# Patient Record
Sex: Male | Born: 1990 | Race: White | Hispanic: No | Marital: Married | Smoking: Never smoker
Health system: Southern US, Academic
[De-identification: ages and names within clinical notes are randomized; demographics above are authoritative.]

## PROBLEM LIST (undated history)

## (undated) DIAGNOSIS — J45909 Unspecified asthma, uncomplicated: Secondary | ICD-10-CM

## (undated) DIAGNOSIS — E559 Vitamin D deficiency, unspecified: Secondary | ICD-10-CM

## (undated) DIAGNOSIS — E538 Deficiency of other specified B group vitamins: Secondary | ICD-10-CM

## (undated) DIAGNOSIS — K219 Gastro-esophageal reflux disease without esophagitis: Secondary | ICD-10-CM

## (undated) HISTORY — PX: HX TONSILLECTOMY: SHX27

## (undated) HISTORY — DX: Vitamin D deficiency, unspecified: E55.9

## (undated) HISTORY — DX: Deficiency of other specified B group vitamins: E53.8

## (undated) HISTORY — PX: SHOULDER ARTHROSCOPY W/ LABRAL REPAIR: SHX2399

---
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
## 2019-06-07 LAB — ENTER/EDIT EXTERNAL COMMON LAB RESULTS
CHOLESTEROL: 210
HDL-CHOLESTEROL: 43
LDL (CALCULATED): 137
LDL CHOLESTEROL,DIRECT: 30
TRIGLYCERIDES: 149

## 2022-01-14 ENCOUNTER — Other Ambulatory Visit (HOSPITAL_COMMUNITY): Payer: Self-pay

## 2022-01-14 DIAGNOSIS — M25512 Pain in left shoulder: Secondary | ICD-10-CM

## 2022-01-14 DIAGNOSIS — S43401D Unspecified sprain of right shoulder joint, subsequent encounter: Secondary | ICD-10-CM

## 2022-01-25 ENCOUNTER — Inpatient Hospital Stay
Admission: RE | Admit: 2022-01-25 | Discharge: 2022-01-25 | Disposition: A | Discharge status: Home / Self Care | Payer: No Typology Code available for payment source | Source: Ambulatory Visit

## 2022-01-25 ENCOUNTER — Inpatient Hospital Stay (HOSPITAL_COMMUNITY)
Admission: RE | Admit: 2022-01-25 | Discharge: 2022-01-25 | Disposition: A | Discharge status: Home / Self Care | Payer: No Typology Code available for payment source | Source: Ambulatory Visit

## 2022-01-25 ENCOUNTER — Other Ambulatory Visit: Payer: Self-pay

## 2022-01-25 DIAGNOSIS — M25512 Pain in left shoulder: Secondary | ICD-10-CM | POA: Insufficient documentation

## 2022-01-25 DIAGNOSIS — S43401D Unspecified sprain of right shoulder joint, subsequent encounter: Secondary | ICD-10-CM | POA: Insufficient documentation

## 2022-02-02 ENCOUNTER — Telehealth (INDEPENDENT_AMBULATORY_CARE_PROVIDER_SITE_OTHER): Payer: Self-pay | Admitting: Internal Medicine

## 2022-02-02 NOTE — Telephone Encounter (Signed)
PATIENT IS REQUESTING MRI RESULTS   THANKS OLIVIA

## 2022-02-04 NOTE — Telephone Encounter (Signed)
Let patient know that the right shoulder MRI shows a labral tear and there is possibly one on the left I would recommend orthopedics for 2nd opinion

## 2022-02-11 NOTE — Nursing Note (Signed)
Patient went to [facility] and they gave referral to ortho.  Patient being seen by ortho for shoulder tear.

## 2022-08-03 ENCOUNTER — Other Ambulatory Visit: Payer: Self-pay

## 2022-08-03 ENCOUNTER — Inpatient Hospital Stay (HOSPITAL_BASED_OUTPATIENT_CLINIC_OR_DEPARTMENT_OTHER)
Admission: RE | Admit: 2022-08-03 | Discharge: 2022-08-03 | Disposition: A | Discharge status: Home / Self Care | Payer: No Typology Code available for payment source | Source: Ambulatory Visit | Attending: Orthopaedic Surgery | Admitting: Orthopaedic Surgery

## 2022-08-03 ENCOUNTER — Other Ambulatory Visit (HOSPITAL_COMMUNITY): Payer: Self-pay | Admitting: Orthopaedic Surgery

## 2022-08-03 ENCOUNTER — Other Ambulatory Visit: Payer: No Typology Code available for payment source | Attending: Orthopaedic Surgery

## 2022-08-03 DIAGNOSIS — Z01818 Encounter for other preprocedural examination: Secondary | ICD-10-CM | POA: Insufficient documentation

## 2022-08-03 LAB — CBC WITH DIFF
BASOPHIL #: 0.1 10*3/uL (ref 0.00–0.10)
BASOPHIL %: 1 % (ref 0–1)
EOSINOPHIL #: 0.2 10*3/uL (ref 0.00–0.50)
EOSINOPHIL %: 2 %
HCT: 44.4 % (ref 36.7–47.1)
HGB: 14.9 g/dL (ref 12.5–16.3)
LYMPHOCYTE #: 3.1 10*3/uL — ABNORMAL HIGH (ref 1.00–3.00)
LYMPHOCYTE %: 34 % (ref 16–44)
MCH: 30.8 pg (ref 23.8–33.4)
MCHC: 33.5 g/dL (ref 32.5–36.3)
MCV: 92 fL (ref 73.0–96.2)
MONOCYTE #: 0.9 10*3/uL (ref 0.30–1.00)
MONOCYTE %: 10 % (ref 5–13)
MPV: 8.5 fL (ref 7.4–11.4)
NEUTROPHIL #: 5 10*3/uL (ref 1.85–7.80)
NEUTROPHIL %: 54 % (ref 43–77)
PLATELETS: 431 10*3/uL (ref 140–440)
RBC: 4.83 10*6/uL (ref 4.06–5.63)
RDW: 13.1 % (ref 12.1–16.2)
WBC: 9.2 10*3/uL (ref 3.6–10.2)

## 2022-08-03 LAB — URINALYSIS, MICROSCOPIC
RBCS: 2 /hpf (ref <4)
SQUAMOUS EPITHELIAL: <1 /hpf (ref <28)

## 2022-08-03 LAB — URINALYSIS, MACROSCOPIC
BILIRUBIN: NEGATIVE mg/dL
BLOOD: 0.06 mg/dL — AB
GLUCOSE: NEGATIVE mg/dL
KETONES: NEGATIVE mg/dL
LEUKOCYTES: NEGATIVE WBCs/uL
NITRITE: NEGATIVE
PH: 5.5 (ref 5.0–9.0)
PROTEIN: NEGATIVE mg/dL
SPECIFIC GRAVITY: 1.031 — ABNORMAL HIGH (ref 1.002–1.030)
UROBILINOGEN: NORMAL mg/dL

## 2022-08-03 LAB — BASIC METABOLIC PANEL
ANION GAP: 9 mmol/L (ref 4–13)
BUN/CREA RATIO: 16 (ref 6–22)
BUN: 13 mg/dL (ref 7–25)
CALCIUM: 10 mg/dL (ref 8.6–10.3)
CHLORIDE: 108 mmol/L — ABNORMAL HIGH (ref 98–107)
CO2 TOTAL: 26 mmol/L (ref 21–31)
CREATININE: 0.82 mg/dL (ref 0.60–1.30)
ESTIMATED GFR: 122 mL/min/{1.73_m2} (ref >59)
GLUCOSE: 77 mg/dL (ref 74–109)
OSMOLALITY, CALCULATED: 284 mOsm/kg (ref 270–290)
POTASSIUM: 3.5 mmol/L (ref 3.5–5.1)
SODIUM: 143 mmol/L (ref 136–145)

## 2022-08-04 DIAGNOSIS — I498 Other specified cardiac arrhythmias: Secondary | ICD-10-CM

## 2022-08-04 DIAGNOSIS — R9431 Abnormal electrocardiogram [ECG] [EKG]: Secondary | ICD-10-CM

## 2022-08-04 LAB — ECG 12 LEAD
Atrial Rate: 79 {beats}/min
Calculated P Axis: 57 degrees
Calculated R Axis: 57 degrees
Calculated T Axis: 23 degrees
PR Interval: 186 ms
QRS Duration: 114 ms
QT Interval: 380 ms
QTC Calculation: 435 ms
Ventricular rate: 79 {beats}/min

## 2022-08-06 ENCOUNTER — Encounter (HOSPITAL_COMMUNITY): Payer: Self-pay | Admitting: Orthopaedic Surgery

## 2022-08-06 ENCOUNTER — Ambulatory Visit (HOSPITAL_COMMUNITY)
Payer: No Typology Code available for payment source | Admitting: Student in an Organized Health Care Education/Training Program

## 2022-08-06 ENCOUNTER — Inpatient Hospital Stay
Admission: RE | Admit: 2022-08-06 | Discharge: 2022-08-06 | Disposition: A | Discharge status: Home / Self Care | Payer: No Typology Code available for payment source | Source: Ambulatory Visit | Attending: Orthopaedic Surgery | Admitting: Orthopaedic Surgery

## 2022-08-06 ENCOUNTER — Encounter (HOSPITAL_COMMUNITY)
Admission: RE | Disposition: A | Discharge status: Home / Self Care | Payer: Self-pay | Source: Ambulatory Visit | Attending: Orthopaedic Surgery

## 2022-08-06 ENCOUNTER — Other Ambulatory Visit: Payer: Self-pay

## 2022-08-06 DIAGNOSIS — S43491A Other sprain of right shoulder joint, initial encounter: Secondary | ICD-10-CM | POA: Insufficient documentation

## 2022-08-06 DIAGNOSIS — Z6841 Body Mass Index (BMI) 40.0 and over, adult: Secondary | ICD-10-CM | POA: Insufficient documentation

## 2022-08-06 HISTORY — DX: Unspecified asthma, uncomplicated: J45.909

## 2022-08-06 HISTORY — DX: Gastro-esophageal reflux disease without esophagitis: K21.9

## 2022-08-06 SURGERY — ARTHROSCOPY SHOULDER
Anesthesia: General | Site: Shoulder | Laterality: Right | Wound class: Clean Wound: Uninfected operative wounds in which no inflammation occurred

## 2022-08-06 MED ORDER — DEXAMETHASONE SODIUM PHOSPHATE 4 MG/ML INJECTION SOLUTION
INTRAMUSCULAR | Status: AC
Start: 2022-08-06 — End: 2022-08-06
  Filled 2022-08-06: qty 1

## 2022-08-06 MED ORDER — SODIUM CHLORIDE 0.9 % (FLUSH) INJECTION SYRINGE
3.0000 mL | INJECTION | Freq: Three times a day (TID) | INTRAMUSCULAR | Status: DC
Start: 2022-08-06 — End: 2022-08-06

## 2022-08-06 MED ORDER — HYDROMORPHONE 2 MG/ML INJECTION WRAPPER
INJECTION | Freq: Once | INTRAMUSCULAR | Status: DC | PRN
Start: 2022-08-06 — End: 2022-08-06
  Administered 2022-08-06 (×2): 1 mg via INTRAVENOUS

## 2022-08-06 MED ORDER — SODIUM CHLORIDE 0.9 % INTRAVENOUS PIGGYBACK
INJECTION | INTRAVENOUS | Status: AC
Start: 2022-08-06 — End: 2022-08-06
  Filled 2022-08-06: qty 150

## 2022-08-06 MED ORDER — ONDANSETRON HCL (PF) 4 MG/2 ML INJECTION SOLUTION
4.0000 mg | Freq: Four times a day (QID) | INTRAMUSCULAR | Status: DC | PRN
Start: 2022-08-06 — End: 2022-08-06
  Administered 2022-08-06: 4 mg via INTRAVENOUS
  Filled 2022-08-06: qty 2

## 2022-08-06 MED ORDER — LIDOCAINE (PF) 100 MG/5 ML (2 %) INTRAVENOUS SYRINGE
INJECTION | Freq: Once | INTRAVENOUS | Status: DC | PRN
Start: 2022-08-06 — End: 2022-08-06
  Administered 2022-08-06: 100 mg via INTRAVENOUS

## 2022-08-06 MED ORDER — DEXMEDETOMIDINE 100 MCG/ML INTRAVENOUS SOLUTION
Freq: Once | INTRAVENOUS | Status: DC | PRN
Start: 2022-08-06 — End: 2022-08-06
  Administered 2022-08-06: 20 ug via INTRAVENOUS

## 2022-08-06 MED ORDER — FENTANYL (PF) 50 MCG/ML INJECTION SOLUTION
INTRAMUSCULAR | Status: AC
Start: 2022-08-06 — End: 2022-08-06
  Filled 2022-08-06: qty 2

## 2022-08-06 MED ORDER — ONDANSETRON HCL (PF) 4 MG/2 ML INJECTION SOLUTION
INTRAMUSCULAR | Status: AC
Start: 2022-08-06 — End: 2022-08-06
  Filled 2022-08-06: qty 2

## 2022-08-06 MED ORDER — ALBUTEROL SULFATE 2.5 MG/3 ML (0.083 %) SOLUTION FOR NEBULIZATION
2.5000 mg | INHALATION_SOLUTION | Freq: Once | RESPIRATORY_TRACT | Status: DC | PRN
Start: 2022-08-06 — End: 2022-08-06

## 2022-08-06 MED ORDER — MIDAZOLAM 5 MG/ML INJECTION WRAPPER
INTRAMUSCULAR | Status: AC
Start: 2022-08-06 — End: 2022-08-06
  Filled 2022-08-06: qty 1

## 2022-08-06 MED ORDER — FAMOTIDINE (PF) 20 MG/2 ML INTRAVENOUS SOLUTION
INTRAVENOUS | Status: AC
Start: 2022-08-06 — End: 2022-08-06
  Filled 2022-08-06: qty 2

## 2022-08-06 MED ORDER — ROPIVACAINE (PF) 2 MG/ML (0.2 %) INJECTION SOLUTION
INTRAMUSCULAR | Status: AC
Start: 2022-08-06 — End: 2022-08-06
  Filled 2022-08-06: qty 10

## 2022-08-06 MED ORDER — IPRATROPIUM 0.5 MG-ALBUTEROL 3 MG (2.5 MG BASE)/3 ML NEBULIZATION SOLN
3.0000 mL | INHALATION_SOLUTION | Freq: Once | RESPIRATORY_TRACT | Status: DC | PRN
Start: 2022-08-06 — End: 2022-08-06

## 2022-08-06 MED ORDER — LACTATED RINGERS INTRAVENOUS SOLUTION
INTRAVENOUS | Status: DC
Start: 2022-08-06 — End: 2022-08-06

## 2022-08-06 MED ORDER — MIDAZOLAM 5 MG/ML INJECTION WRAPPER
2.0000 mg | Freq: Once | INTRAMUSCULAR | Status: DC | PRN
Start: 2022-08-06 — End: 2022-08-06
  Administered 2022-08-06: 2 mg via INTRAVENOUS

## 2022-08-06 MED ORDER — OXYCODONE-ACETAMINOPHEN 5 MG-325 MG TABLET
1.0000 | ORAL_TABLET | ORAL | Status: DC | PRN
Start: 2022-08-06 — End: 2022-08-06
  Administered 2022-08-06: 1 via ORAL
  Filled 2022-08-06: qty 1

## 2022-08-06 MED ORDER — ONDANSETRON HCL (PF) 4 MG/2 ML INJECTION SOLUTION
4.0000 mg | Freq: Once | INTRAMUSCULAR | Status: AC
Start: 2022-08-06 — End: 2022-08-06
  Administered 2022-08-06: 4 mg via INTRAVENOUS

## 2022-08-06 MED ORDER — MIDAZOLAM 5 MG/ML INJECTION WRAPPER
Freq: Once | INTRAMUSCULAR | Status: DC | PRN
Start: 2022-08-06 — End: 2022-08-06
  Administered 2022-08-06: 5 mg via INTRAVENOUS

## 2022-08-06 MED ORDER — CEFAZOLIN 1 GRAM SOLUTION FOR INJECTION
INTRAMUSCULAR | Status: AC
Start: 2022-08-06 — End: 2022-08-06
  Filled 2022-08-06: qty 30

## 2022-08-06 MED ORDER — OXYCODONE-ACETAMINOPHEN 5 MG-325 MG TABLET
1.0000 | ORAL_TABLET | Freq: Four times a day (QID) | ORAL | 0 refills | Status: DC | PRN
Start: 2022-08-06 — End: 2024-02-28

## 2022-08-06 MED ORDER — SUCCINYLCHOLINE 20 MG/ML INTRAVENOUS WRAPPER
INJECTION | Freq: Once | INTRAVENOUS | Status: DC | PRN
Start: 2022-08-06 — End: 2022-08-06
  Administered 2022-08-06: 160 mg via INTRAVENOUS

## 2022-08-06 MED ORDER — MORPHINE 10 MG/ML INJECTION WRAPPER
INTRAVENOUS | Status: AC
Start: 2022-08-06 — End: 2022-08-06
  Filled 2022-08-06: qty 1

## 2022-08-06 MED ORDER — DEXAMETHASONE SODIUM PHOSPHATE 4 MG/ML INJECTION SOLUTION
4.0000 mg | Freq: Once | INTRAMUSCULAR | Status: AC
Start: 2022-08-06 — End: 2022-08-06
  Administered 2022-08-06: 4 mg via INTRAVENOUS

## 2022-08-06 MED ORDER — ONDANSETRON HCL (PF) 4 MG/2 ML INJECTION SOLUTION
4.0000 mg | Freq: Once | INTRAMUSCULAR | Status: DC | PRN
Start: 2022-08-06 — End: 2022-08-06

## 2022-08-06 MED ORDER — HYDROMORPHONE 2 MG/ML INJECTION WRAPPER
0.5000 mg | INJECTION | INTRAMUSCULAR | Status: DC | PRN
Start: 2022-08-06 — End: 2022-08-06

## 2022-08-06 MED ORDER — FENTANYL (PF) 50 MCG/ML INJECTION WRAPPER
INJECTION | Freq: Once | INTRAMUSCULAR | Status: DC | PRN
Start: 2022-08-06 — End: 2022-08-06
  Administered 2022-08-06: 50 ug via INTRAVENOUS
  Administered 2022-08-06: 100 ug via INTRAVENOUS
  Administered 2022-08-06: 50 ug via INTRAVENOUS

## 2022-08-06 MED ORDER — SODIUM CHLORIDE 0.9 % (FLUSH) INJECTION SYRINGE
3.0000 mL | INJECTION | INTRAMUSCULAR | Status: DC | PRN
Start: 2022-08-06 — End: 2022-08-06

## 2022-08-06 MED ORDER — SODIUM CHLORIDE 0.9 % INTRAVENOUS PIGGYBACK
2.0000 g | Freq: Once | INTRAVENOUS | Status: AC
Start: 2022-08-06 — End: 2022-08-06
  Administered 2022-08-06: 3 g via INTRAVENOUS

## 2022-08-06 MED ORDER — SUGAMMADEX 100 MG/ML INTRAVENOUS SOLUTION
Freq: Once | INTRAVENOUS | Status: DC | PRN
Start: 2022-08-06 — End: 2022-08-06
  Administered 2022-08-06: 500 mg via INTRAVENOUS

## 2022-08-06 MED ORDER — ROCURONIUM 10 MG/ML INTRAVENOUS SOLUTION
Freq: Once | INTRAVENOUS | Status: DC | PRN
Start: 2022-08-06 — End: 2022-08-06
  Administered 2022-08-06: 30 mg via INTRAVENOUS

## 2022-08-06 MED ORDER — PROCHLORPERAZINE EDISYLATE 10 MG/2 ML (5 MG/ML) INJECTION SOLUTION
5.0000 mg | Freq: Once | INTRAMUSCULAR | Status: DC | PRN
Start: 2022-08-06 — End: 2022-08-06

## 2022-08-06 MED ORDER — FENTANYL (PF) 50 MCG/ML INJECTION WRAPPER
50.0000 ug | INJECTION | INTRAMUSCULAR | Status: DC | PRN
Start: 2022-08-06 — End: 2022-08-06
  Administered 2022-08-06: 50 ug via INTRAVENOUS

## 2022-08-06 MED ORDER — FAMOTIDINE (PF) 20 MG/2 ML INTRAVENOUS SOLUTION
20.0000 mg | Freq: Once | INTRAVENOUS | Status: AC
Start: 2022-08-06 — End: 2022-08-06
  Administered 2022-08-06: 20 mg via INTRAVENOUS

## 2022-08-06 MED ORDER — FENTANYL (PF) 50 MCG/ML INJECTION WRAPPER
25.0000 ug | INJECTION | INTRAMUSCULAR | Status: DC | PRN
Start: 2022-08-06 — End: 2022-08-06
  Administered 2022-08-06 (×2): 25 ug via INTRAVENOUS

## 2022-08-06 MED ORDER — PROPOFOL 10 MG/ML IV BOLUS
INJECTION | Freq: Once | INTRAVENOUS | Status: DC | PRN
Start: 2022-08-06 — End: 2022-08-06
  Administered 2022-08-06: 250 mg via INTRAVENOUS

## 2022-08-06 MED ORDER — LACTATED RINGERS INTRAVENOUS SOLUTION
INTRAVENOUS | Status: DC
Start: 2022-08-06 — End: 2022-08-06
  Administered 2022-08-06: 0 via INTRAVENOUS

## 2022-08-06 SURGICAL SUPPLY — 46 items
ANCHOR SUT 1.8MM 2 KNOTLESS FIBERTAK GEN2 ×3 IMPLANT
BANDAGE COFLX 5YDX4IN NONST CHSV SLF ADH FOAM COMPRESS TAN LF (WOUND CARE SUPPLY) ×1 IMPLANT
BLADE 11 2 END CBNSTL SURG STRL DISP (SURGICAL CUTTING SUPPLIES) ×1 IMPLANT
BLADE SHAVER 13CM 4MM EXCLBR C_OOLCUT STRL DISP (ENDOSCOPIC SUPPLIES) ×1 IMPLANT
BURR SHAVER 13CM 4MM COOLCUT 8 FLUTE OVAL STRL DISP (ENDOSCOPIC SUPPLIES) ×1 IMPLANT
CANNULA ARTHRO 8.25MM 7CM TWIST-IN SHLDR OBTURATOR THREAD TRANSLUC STRL DISP (ENDOSCOPIC SUPPLIES) ×2 IMPLANT
CLEANER INSTR PREPZYME MUL-TRD CONTAINR NARSL NEUT PH BDGR 22OZ (MISCELLANEOUS PT CARE ITEMS) ×1
CONV USE 31829 - NEEDLE HYPO  18GA 1.5IN STD REG BVL LF (MED SURG SUPPLIES) ×1 IMPLANT
CONV USE ITEM 329146 - CLEANER INSTR PREPZYME MUL-TRD CONTAINR NARSL NEUT PH BDGR 22OZ (MISCELLANEOUS PT CARE ITEMS) ×1 IMPLANT
COUNTER 20 CNT BLOCK ADH NEEDLE STRL LF  RD SHARP FOAM 15.75X11.5X14IN DISP (MED SURG SUPPLIES) ×1 IMPLANT
COVER 53X24IN MAYOSTAND PRXM STRL DISP EQP SMS LF (DRAPE/PACKS/SHEETS/OR TOWEL) ×2 IMPLANT
COVER TBL 90X50IN STD SMS REINF FNFLD STRL LF  DISP (DRAPE/PACKS/SHEETS/OR TOWEL) ×3 IMPLANT
DISCONTINUED NO SUB - DRESS TRNSPR 2.75X2.375IN FILM IV STRL LF (WOUND CARE SUPPLY) ×5 IMPLANT
DISCONTINUED NO SUB - DRESS TRNSPR 4.5X4IN FILM IV STRL LF (WOUND CARE SUPPLY) ×1 IMPLANT
DRAPE INCS ANTIMIC 23X23IN IOBN2 TRNSPR (DRAPE/PACKS/SHEETS/OR TOWEL) ×1 IMPLANT
DRESS TRNSPR 2.75X2.375IN FILM IV STRL LF (WOUND CARE/ENTEROSTOMAL SUPPLY) ×5
DRESS TRNSPR 4.5X4IN FILM IV STRL LF (WOUND CARE/ENTEROSTOMAL SUPPLY) ×1
GLOVE SURG 7.5 LF  PF SMOOTH BEAD CUF INTLK STRL BLU 11.8IN PROTEXIS NEU-THERA PLISPRN THK7.9 MIL (GLOVES AND ACCESSORIES) ×4 IMPLANT
GLOVE SURG 7.5 LTX PF SMOOTH BEAD CUF STRL YW 12IN PROTEXIS (GLOVES AND ACCESSORIES) ×2 IMPLANT
GOWN SURG XL STD LGTH L3 HKLP CLSR RGLN SLEEVE TWL STRL LF  DISP GRN AERO BLU PRFRM FBRC (DRAPE/PACKS/SHEETS/OR TOWEL) ×1 IMPLANT
GOWN SURG XL STD LGTH L3 NONREINFORCE HKLP CLSR TWL STRL LF  DISP BLU SPECTRUM SMS (DRAPE/PACKS/SHEETS/OR TOWEL) ×1
GOWN SURG XL STD LGTH L3 NONREINFORCE HKLP CLSR TWL STRL LF DISP BLU SPECTRUM SMS (DRAPE/PACKS/SHEETS/OR TOWEL) ×1 IMPLANT
GOWN SURG XL XLNG L4 REINF HKLP CLSR SET IN SLEEVE STRL LF  DISP BLU SIRUS SMS PE 56IN (DRAPE/PACKS/SHEETS/OR TOWEL) ×1 IMPLANT
INSTR ORTHO 1.8MM SHAVER DRILL FLXB OBTURATOR (SURGICAL INSTRUMENTS) ×1 IMPLANT
LABEL MED CORRECT MED LABELING SYS 4 FLG 2 SHEET 24 PRPRNT STRL (MED SURG SUPPLIES) ×1 IMPLANT
MAT FLR 110X29IN STD ABSRBENT WATERPROF BACKSHEET RL (MED SURG SUPPLIES) ×3 IMPLANT
NEEDLE HYPO  18GA 1.5IN STD REG BVL LF (MED SURG SUPPLIES) ×1
NEEDLE SPINAL PNK 3.5IN 18GA QUINCKE REG WL POLYPROP QUINCKE TIP STRL LF  DISP (MED SURG SUPPLIES) ×1 IMPLANT
NEEDLE SUT MEGALOADER HD SCORPION (SUTURE/WOUND CLOSURE) ×1 IMPLANT
PACK SURG ECLIPSE SHLDR PCH STRL DISP 114X77IN 100X66IN LF (DRAPE/PACKS/SHEETS/OR TOWEL) ×1 IMPLANT
PAD ABDOMINAL 8X7.5IN LF  STRL (WOUND CARE SUPPLY) ×1 IMPLANT
PASSER SUTLASSO SD 90D UP STR 1.8MM 3.8MM SHLDR THB PAD SUT NITINOL LABRAL ASCP ROTR CUF REPR STRL (SUTURE/WOUND CLOSURE) ×1 IMPLANT
PROBE ESURG APOLLORF 90D ML PRT (SURGICAL CUTTING SUPPLIES) ×1 IMPLANT
PUMP TUBING 13FT CONT WV III DUALWAVE ARTHRO STRL DISP (MED SURG SUPPLIES) ×1 IMPLANT
SET TUBING DUALWAVE CASSETTE OFLW ASCP PUMP STRL DISP (ENDOSCOPIC SUPPLIES) ×1 IMPLANT
SLING ORTHO LRG 17.5X8.5IN ARM SHLDR PAD ENV BRTHBL HKLP (ORTHOPEDICS (NOT IMPLANTS)) ×1 IMPLANT
SOL IRRG LR 3L PRSV N-PYRG FLXB CONTAINR STRL LF (MEDICATIONS/SOLUTIONS) ×2 IMPLANT
SPONGE GAUZE 2X2IN COTTON 8P WOVEN FOLD EDGE XRY DETECT LF  STRL (WOUND CARE SUPPLY) ×4 IMPLANT
SPONGE GAUZE 4X4IN MDCHC COTTON 12 PLY TY 7 LF  STRL DISP (WOUND CARE SUPPLY) ×1 IMPLANT
SPONGE LAP 18X18IN PREWASH RIGID TRY STRL LF  WHT (MED SURG SUPPLIES) ×1 IMPLANT
SUTURE 4-0 C-14 SURGIPRO2 18IN BLU MONOF NONAB (SUTURE/WOUND CLOSURE) ×2 IMPLANT
SYRINGE LL 10ML LF  STRL GRAD N-PYRG DEHP-FR PVC FREE MED DISP (MED SURG SUPPLIES) ×1 IMPLANT
TOWEL 24X16IN COTTON BLU DISP SURG STRL LF (DRAPE/PACKS/SHEETS/OR TOWEL) ×4 IMPLANT
TUBING IRRG 6FT DUALWAVE BKFL CK VALVE EXT STRL DISP (ENDOSCOPIC SUPPLIES) ×1 IMPLANT
TUBING SUCT CLR 12FT .25IN ARGYLE PVC NCDTV STR MALE FEMALE MLD CONN STRL LF (MED SURG SUPPLIES) ×3 IMPLANT
TUBING SUCT CLR 6FT .25IN ARGYLE PVC NCDTV STR MALE FEMALE MLD CONN STRL LF (MED SURG SUPPLIES) ×1 IMPLANT

## 2022-08-06 NOTE — H&P (Signed)
Paper H&P attached to chart. See scanned document.

## 2022-08-06 NOTE — Anesthesia Postprocedure Evaluation (Signed)
Anesthesia Post Op Evaluation    Patient: Bradley Mendoza  Procedure(s):  ARTHROSCOPIC LABRAL REPAIR OF RIGHT SHOULDER    Last Vitals:Temperature: 36.2 C (97.1 F) (08/06/22 1711)  Heart Rate: 64 (08/06/22 1711)  BP (Non-Invasive): 129/78 (08/06/22 1711)  Respiratory Rate: 12 (08/06/22 1711)  SpO2: 99 % (08/06/22 1711)    No notable events documented.    Patient is sufficiently recovered from the effects of anesthesia to participate in the evaluation and has returned to their pre-procedure level.  Patient location during evaluation: PACU       Patient participation: complete - patient participated  Level of consciousness: awake and alert and responsive to verbal stimuli    Pain management: adequate  Airway patency: patent    Anesthetic complications: no  Cardiovascular status: acceptable  Respiratory status: acceptable  Hydration status: acceptable  Patient post-procedure temperature: Pt Normothermic   PONV Status: Absent

## 2022-08-06 NOTE — Discharge Instructions (Addendum)
Follow up in office on January 26th, at 11:10 with Dr. Kathlen Brunswick.     Orthopaedic Discharge Instructions:    Non weightbearing to the operative extremity.  Maintain sling.  No active range of motion of the shoulder.    May remove dressing on postoperative day 3.  Leave incisions open to air if dry.  Cover with gauze dressing if there is persistent drainage. Call the office with any questions or problems.    Ice to alleviate pain and swelling    Take pain medication as prescribed    Follow-up in the orthopedics clinic at your scheduled appointment.  Please call the office to confirm your appointment.  Please also call with any questions or concerns.      [place] [facility], [place], [place] [address]  [phone]

## 2022-08-06 NOTE — Interval H&P Note (Signed)
[facility]      H&P UPDATE FORM                                                                                  Valeria, Krisko, 32 y.o. male  Date of Admission:  08/06/2022  Date of Birth:  01/31/1991    08/06/2022    STOP: IF H&P IS GREATER THAN 30 DAYS FROM SURGICAL DAY COMPLETE NEW H&P IS REQUIRED.     H & P updated the day of the procedure.  1.  H&P completed within 30 days of surgical procedure and has been reviewed within 24 hours of admission but prior to surgery or a procedure requiring anesthesia services, the patient has been examined, and no change has occured in the patients condition since the H&P was completed.       Change in medications: No              Comments:     2.  Patient continues to be appropriate candidate for planned surgical procedure. YES    [employer], DO

## 2022-08-06 NOTE — Anesthesia Procedure Notes (Signed)
Bradley Mendoza    Airway Note  General Information and Staff   Authorizing provider: Lavena Stanford, DO  Performing provider: Jasmine December, CRNA        Urgency: elective    Airway not difficult    Indications and Patient Condition  Pt location: In Or  Indications for airway management: anesthesia  Spontaneous ventilation: present  Sedation level: deep  Preoxygenated: yes  Patient position: sniffing  Mask difficulty assessment: 1 - vent by mask        Final Airway Details  Final airway type: endotracheal airway        Successful airway: ETT     Successful intubation technique: direct laryngoscopy  Facilitating devices/methods: cricoid pressure              Blade: Macintosh  Blade size: #3  Airway size (mm): 8.0  Cormack-Lehane Classification: grade I - full view of glottis  Placement verified by: chest auscultation and capnometry   Marked at 24  Measured from: lips  Secured with: Tape  Number of attempts at approach: 1Airway complications: Atraumatic

## 2022-08-06 NOTE — OR Surgeon (Signed)
ORTHOPAEDIC SURGERY OPERATIVE REPORT:    Patient Name: Bradley Mendoza  Date of Birth: Mar 05, 1991  Age: 32 y.o.  Gender: male  MRN: [id number]    Date of Surgery: 08/06/2022     Surgeon: Murvin Donning, DO    Preoperative Diagnosis:  Right shoulder labral tear    Postoperative Diagnosis:  Right shoulder labral tear    Procedure(s):  Diagnostic and operative arthroscopy right shoulder with labral repair    Type of Anesthesia: General    Anesthesia Staff: Anesthesiologist: Lavena Stanford, DO  CRNA: Donita Brooks, CRNA; Jasmine December, CRNA   OR Staff: Circulator: Leonia Reeves, RN  PERIOPERATIVE CARE ASSISTANT: Ellwood Dense, [place]; Avel Peace, PCA  Relief Circulator: Lollie Marrow, RN  Scrub Person: Cristine Polio, ST  Scrub First Assist: Kathlen Brunswick, ST   Implants: 3 [employer] knotless FiberTak anchors     Implant Name Type Inv. Item Serial No. Manufacturer Lot No. LRB No. Used Action   ANCHOR SUT 1.8MM 2 KNOTLESS FIBERTAK GEN2 - [id number]  ANCHOR SUT 1.8MM 2 KNOTLESS [place] [id number] AR[employer]X [id number] Right 1 Implanted   ANCHOR SUT 1.8MM 2 KNOTLESS FIBERTAK GEN2 - [id number]  ANCHOR SUT 1.8MM 2 KNOTLESS [facility] GEN2 [id number] AR[employer]X [id number] Right 1 Implanted   ANCHOR SUT 1.8MM 2 KNOTLESS FIBERTAK GEN2 - [id number]  ANCHOR SUT 1.8MM 2 KNOTLESS [place] [id number] AR[employer]X [id number] Right 1 Implanted        Estimated Blood Loss:  Less than 5 mL    Counts:     Sponge: Correct    Needle: Correct    Sharp: Correct    Instrument: Correct    Drains and/or Packs: None    Significant Events: None    Specimen(s) Removed: * No specimens in log *     Description of Findings: Routine findings consistent with pre-operative diagnosis.    Post-Op Condition of Patient: Stable to PACU    Indications:     Bradley Mendoza  is a very pleasant  32 y.o. male  presenting to [facility] for right shoulder arthroscopy with labral repair.  Patient continues to report right shoulder pain despite adequate conservative management  consisting of injections and physical therapy.  MRI demonstrated anterior labral tear.      Procedure discussed in great detail.  Risks benefits potential complications and outcomes were discussed at length and patient verbalized understanding and did provide written informed consent to proceed.    Procedure:     Patient seen and evaluated in the preoperative area with parents at bedside.  Right shoulder marked as correct operative site.  Appropriate preoperative antibiotics were administered at this time.  H&P reviewed written consent confirmed and posted on the chart.  Patient then taken back to the operative suite transferred to the OR table in the supine position.  All bony prominences covered and padded appropriately.  General anesthesia administered.  Once adequate anesthesia was achieved patient was placed in the lateral decubitus position once again ensuring to pad all bony prominences appropriately.  The shoulder then placed in traction and prepped and draped in normal sterile fashion.  Formal timeout was performed.     Upon completion of timeout posterior viewing portal was established and arthroscope inserted into the glenohumeral joint.  Both the glenoid and humeral head were visualized.  We proceeded to visualize the long head of the biceps and biceps anchor which was intact.  Articular surface of the rotator cuff was visualized and deemed to be intact.  Proceeded the inferior axillary  recess where there was no evidence of loose body.  We then visualized the anterior labrum there was evidence of an extensive tear.  Visualized the subscap tendon which was deemed intact.  At this time anterior portal was established under direct visualization using 18 gauge spinal needle to localize placement.  We then established our superior portal to shuttle sutures during the repair.  18 gauge spinal needle was also used to localize placement.  At this time we performed our labral repair using 3 knotless FiberTak  anchors placed on the anterior aspect of the glenoid.  We did ensure to work from inferior to superior to allow for appropriate visualization and repair.  At this time shoulder was lavaged and then all fluid suctioned from the shoulder.  All instrumentation removed.  Portals closed with nylon suture in simple interrupted fashion.  Patient was placed into an arm sling.  Incisions covered with 4x4s and tape.  Patient was awoke from anesthesia transferred back to the hospital gurney.  Was taken to PACU in stable and satisfactory condition.  Tolerated procedure without complication.     Disposition:      Patient taken to PACU in stable and satisfactory condition.  Stable for discharge home from an orthopedic perspective once cleared per PACU protocol.  He is remained nonweightbearing to the operative extremity.  Maintain sling.  Take pain medication as prescribed.  Follow up in the orthopedics clinic in approximately 2 weeks, sooner if necessary.  Family was in agreement with treatment plan all questions answered to their apparent satisfaction prior to discharge.    Rebeca Alert, D.O.  08/06/22 15:24  [place]  Please call with any questions/concerns     This note has been created with voice recognition software.  Please excuse any errors in transcription.  Occasional wrong word or sound alike substitutions may have occurred due to the inherent limitations of voice recognition software.  Please read the chart carefully and recognize using context with the substitutions may have occurred.

## 2022-08-06 NOTE — Anesthesia Preprocedure Evaluation (Signed)
ANESTHESIA PRE-OP EVALUATION  Planned Procedure: ARTHROSCOPIC LABRAL REPAIR OF RIGHT SHOULDER (Right: Shoulder)  Review of Systems     anesthesia history negative     patient summary reviewed  nursing notes reviewed        Pulmonary   asthma,   Cardiovascular    ECG reviewed ,No peripheral edema,  Exercise Tolerance: > or = 4 METS        GI/Hepatic/Renal    GERD and well controlled        Endo/Other    morbid obesity,      Neuro/Psych/MS   negative neuro/psych ROS,      Cancer                        Physical Assessment      Airway       Mallampati: II    TM distance: >3 FB    Neck ROM: full  Mouth Opening: good.            Dental           (+) caps             Pulmonary    Breath sounds clear to auscultation  (-) no rhonchi, no decreased breath sounds, no wheezes, no rales and no stridor     Cardiovascular    Rhythm: regular  Rate: Normal  (-) no friction rub, carotid bruit is not present, no peripheral edema and no murmur     Other findings  Temporary             Plan  ASA 2     Planned anesthesia type: general           PONV Plan:  I plan to administer pharmcologic prophalaxis antiemetics  POV PLAN:   plan for postoperative opioid use            Intravenous induction     Anesthesia issues/risks discussed are: Post-op Pain Management, Stroke, Aspiration, Cardiac Events/MI, Blood Loss, PONV and Sore Throat.  Anesthetic plan and risks discussed with patient  signed consent obtained          Patient's NPO status is appropriate for Anesthesia.           Plan discussed with CRNA.

## 2022-08-06 NOTE — Anesthesia Transfer of Care (Signed)
ANESTHESIA TRANSFER OF CARE   Bradley Mendoza is a 32 y.o. ,male, Weight: (!) 159 kg (350 lb)   had Procedure(s):  ARTHROSCOPIC LABRAL REPAIR OF RIGHT SHOULDER  performed  08/06/22   Primary Service: Georgie Chard*    Past Medical History:   Diagnosis Date   . Asthma    . Esophageal reflux       Allergy History as of 08/06/22        No Known Allergies                  I completed my transfer of care / handoff to the receiving personnel during which we discussed:  Access, Airway, All key/critical aspects of case discussed, Analgesia, Antibiotics, Expectation of post procedure, Fluids/Product, Gave opportunity for questions and acknowledgement of understanding, Labs and PMHx      Post Location: PACU                                                           Last OR Temp: Temperature: 36.1 C (97 F)  ABG:  POTASSIUM   Date Value Ref Range Status   08/03/2022 3.5 3.5 - 5.1 mmol/L Final     KETONES   Date Value Ref Range Status   08/03/2022 Negative Negative, Trace mg/dL Final     CALCIUM   Date Value Ref Range Status   08/03/2022 10.0 8.6 - 10.3 mg/dL Final     Calculated P Axis   Date Value Ref Range Status   08/03/2022 57 degrees Final     Calculated R Axis   Date Value Ref Range Status   08/03/2022 57 degrees Final     Calculated T Axis   Date Value Ref Range Status   08/03/2022 23 degrees Final     Airway:* No LDAs found *  Blood pressure 119/78, pulse 71, temperature 36.1 C (97 F), resp. rate (!) 10, height 1.905 m (6\' 3" ), weight (!) 159 kg (350 lb), SpO2 96%.

## 2022-10-12 ENCOUNTER — Encounter (INDEPENDENT_AMBULATORY_CARE_PROVIDER_SITE_OTHER): Payer: Self-pay | Admitting: Internal Medicine

## 2022-10-13 ENCOUNTER — Encounter (INDEPENDENT_AMBULATORY_CARE_PROVIDER_SITE_OTHER): Payer: Self-pay | Admitting: NURSE PRACTITIONER

## 2022-10-14 ENCOUNTER — Encounter (INDEPENDENT_AMBULATORY_CARE_PROVIDER_SITE_OTHER): Payer: Self-pay | Admitting: NURSE PRACTITIONER

## 2022-10-14 ENCOUNTER — Other Ambulatory Visit: Payer: Self-pay

## 2022-10-14 ENCOUNTER — Ambulatory Visit (INDEPENDENT_AMBULATORY_CARE_PROVIDER_SITE_OTHER): Payer: Commercial Managed Care - PPO | Admitting: NURSE PRACTITIONER

## 2022-10-14 ENCOUNTER — Ambulatory Visit: Payer: Commercial Managed Care - PPO | Attending: NURSE PRACTITIONER | Admitting: NURSE PRACTITIONER

## 2022-10-14 VITALS — BP 136/64 | HR 78 | Ht 75.0 in | Wt 377.2 lb

## 2022-10-14 DIAGNOSIS — Z136 Encounter for screening for cardiovascular disorders: Secondary | ICD-10-CM | POA: Insufficient documentation

## 2022-10-14 DIAGNOSIS — Z6841 Body Mass Index (BMI) 40.0 and over, adult: Secondary | ICD-10-CM | POA: Insufficient documentation

## 2022-10-14 DIAGNOSIS — Z13220 Encounter for screening for lipoid disorders: Secondary | ICD-10-CM | POA: Insufficient documentation

## 2022-10-14 LAB — LIPID PANEL
CHOL/HDL RATIO: 6.1
CHOLESTEROL: 224 mg/dL — ABNORMAL HIGH (ref <200)
HDL CHOL: 37 mg/dL (ref 23–92)
LDL CALC: 146 mg/dL — ABNORMAL HIGH (ref 0–100)
TRIGLYCERIDES: 203 mg/dL — ABNORMAL HIGH (ref <=150)
VLDL CALC: 41 mg/dL (ref 0–50)

## 2022-10-14 MED ORDER — ZEPBOUND 2.5 MG/0.5 ML SUBCUTANEOUS PEN INJECTOR
2.5000 mg | PEN_INJECTOR | SUBCUTANEOUS | 1 refills | Status: DC
Start: 2022-10-14 — End: 2022-12-02

## 2022-10-14 NOTE — Progress Notes (Signed)
[facility]  [address]  [place] [id number]       Name: Bradley Mendoza MRN:  [id number]   Date: 10/14/2022 Age: 32 y.o.       Chief Complaint:    Chief Complaint   Patient presents with    New Patient     New pt to establish care. Wants to discuss weight loss options. Pt had right shoulder surgery in jan 2024 and is doing therapy 2x a week. Pt has c/o of the right hand turning white along with a tingling sensation when lifting his right arm during PT.        HPI:  Bradley Mendoza is a 32 y.o. male who is here today to re establish care.     He states that overall he has been doing well.     He is concerned today about his weight and wanting to discuss options. He has been trying a high protein and low carb diet, in addition to exercising as much as he is able but this is limited due to recent shoulder surgery. Even with making these changes he has had very little success with weight loss and is wanting to discuss medication options.     He is also complaining today about numbness and tingling down his arm on his right sight and into his hand. This is the side he recently had a shoulder tear and operation performed. He is currently in physical therapy and has scheduled f/u appt already with orthopedics for 10-26-22.    He denies any other problems or complaints.         Past Medical History:  Past Medical History:   Diagnosis Date    Asthma     B12 deficiency     Esophageal reflux     Vitamin D deficiency          Past Surgical History:   Procedure Laterality Date    HX TONSILLECTOMY N/A       Current Outpatient Medications   Medication Sig    oxyCODONE-acetaminophen (PERCOCET) 5-325 mg Oral Tablet Take 1 Tablet by mouth Every 6 hours as needed (Patient not taking: Reported on 10/14/2022)    tirzepatide, weight loss, (ZEPBOUND) 2.5 mg/0.5 mL Subcutaneous Pen Injector Inject 0.5 mL (2.5 mg total) under the skin Every 7 days Indications: weight loss management for an obese person     No  Known Allergies    Family History:  Family Medical History:    None           Social History:   Social History     Tobacco Use   Smoking Status Never   Smokeless Tobacco Never     Social History     Substance and Sexual Activity   Alcohol Use Not Currently    Alcohol/week: 1.0 standard drink of alcohol    Types: 1 Cans of beer per week    Comment: occasionally     Social History     Occupational History    Not on file       Review of Systems:  Review of systems as discussed in HPI    Problem List:  Patient Active Problem List   Diagnosis    Morbid obesity with BMI of 45.0-49.9, adult (CMS [facility])       Physical Examination:  BP 136/64 (Site: Right, Patient Position: Sitting, Cuff Size: Adult)   Pulse 78   Ht 1.905 m (6\' 3" )  Wt (!) 171 kg (377 lb 3.2 oz)   SpO2 95%   BMI 47.15 kg/m       Physical Exam  Vitals and nursing note reviewed.   Constitutional:       General: He is not in acute distress.     Appearance: Normal appearance. He is obese. He is not ill-appearing.   HENT:      Head: Normocephalic and atraumatic.   Eyes:      Extraocular Movements: Extraocular movements intact.   Neck:      Vascular: No carotid bruit.   Cardiovascular:      Rate and Rhythm: Normal rate and regular rhythm.      Heart sounds: S1 normal and S2 normal. No murmur heard.  Pulmonary:      Effort: Pulmonary effort is normal.      Breath sounds: Normal breath sounds.   Abdominal:      General: Bowel sounds are normal.      Palpations: Abdomen is soft.   Musculoskeletal:         General: Normal range of motion.   Lymphadenopathy:      Cervical: No cervical adenopathy.   Skin:     General: Skin is warm and dry.      Capillary Refill: Capillary refill takes less than 2 seconds.   Neurological:      General: No focal deficit present.      Mental Status: He is alert and oriented to person, place, and time.   Psychiatric:         Mood and Affect: Mood normal.        Data Reviewed:  Documentation Only on 10/12/2022   Component Date Value Ref  Range Status    CHOLESTEROL 06/07/2019 210   Final    HDL-CHOLESTEROL 06/07/2019 43   Final    LDL (CALCULATED) 06/07/2019 137   Final    TRIGLYCERIDES 06/07/2019 149   Final    LDL CHOLESTEROL,DIRECT 06/07/2019 30   Final   Hospital Outpatient Visit on 08/03/2022   Component Date Value Ref Range Status    Ventricular rate 08/03/2022 79  BPM Final    Atrial Rate 08/03/2022 79  BPM Final    PR Interval 08/03/2022 186  ms Final    QRS Duration 08/03/2022 114  ms Final    QT Interval 08/03/2022 380  ms Final    QTC Calculation 08/03/2022 435  ms Final    Calculated P Axis 08/03/2022 57  degrees Final    Calculated R Axis 08/03/2022 57  degrees Final    Calculated T Axis 08/03/2022 23  degrees Final   Appointment on 08/03/2022   Component Date Value Ref Range Status    SODIUM 08/03/2022 143  136 - 145 mmol/L Final    POTASSIUM 08/03/2022 3.5  3.5 - 5.1 mmol/L Final    CHLORIDE 08/03/2022 108 (H)  98 - 107 mmol/L Final    CO2 TOTAL 08/03/2022 26  21 - 31 mmol/L Final    ANION GAP 08/03/2022 9  4 - 13 mmol/L Final    CALCIUM 08/03/2022 10.0  8.6 - 10.3 mg/dL Final    GLUCOSE 08/03/2022 77  74 - 109 mg/dL Final    BUN 08/03/2022 13  7 - 25 mg/dL Final    CREATININE 08/03/2022 0.82  0.60 - 1.30 mg/dL Final    BUN/CREA RATIO 08/03/2022 16  6 - 22 Final    ESTIMATED GFR 08/03/2022 122  >59 mL/min/1.32m^2 Final  OSMOLALITY, CALCULATED 08/03/2022 284  270 - 290 mOsm/kg Final    WBC 08/03/2022 9.2  3.6 - 10.2 x10^3/uL Final    RBC 08/03/2022 4.83  4.06 - 5.63 x10^6/uL Final    HGB 08/03/2022 14.9  12.5 - 16.3 g/dL Final    HCT 08/03/2022 44.4  36.7 - 47.1 % Final    MCV 08/03/2022 92.0  73.0 - 96.2 fL Final    MCH 08/03/2022 30.8  23.8 - 33.4 pg Final    MCHC 08/03/2022 33.5  32.5 - 36.3 g/dL Final    RDW 08/03/2022 13.1  12.1 - 16.2 % Final    PLATELETS 08/03/2022 431  140 - 440 x10^3/uL Final    MPV 08/03/2022 8.5  7.4 - 11.4 fL Final    NEUTROPHIL % 08/03/2022 54  43 - 77 % Final    LYMPHOCYTE % 08/03/2022 34  16 - 44 %  Final    MONOCYTE % 08/03/2022 10  5 - 13 % Final    EOSINOPHIL % 08/03/2022 2  % Final    BASOPHIL % 08/03/2022 1  0 - 1 % Final    NEUTROPHIL # 08/03/2022 5.00  1.85 - 7.80 x10^3/uL Final    LYMPHOCYTE # 08/03/2022 3.10 (H)  1.00 - 3.00 x10^3/uL Final    MONOCYTE # 08/03/2022 0.90  0.30 - 1.00 x10^3/uL Final    EOSINOPHIL # 08/03/2022 0.20  0.00 - 0.50 x10^3/uL Final    BASOPHIL # 08/03/2022 0.10  0.00 - 0.10 x10^3/uL Final    COLOR 08/03/2022 Light Yellow  Colorless, Light Yellow, Yellow Final    APPEARANCE 08/03/2022 Clear  Clear Final    SPECIFIC GRAVITY 08/03/2022 1.031 (H)  1.002 - 1.030 Final    PH 08/03/2022 5.5  5.0 - 9.0 Final    LEUKOCYTES 08/03/2022 Negative  Negative, 100  WBCs/uL Final    NITRITE 08/03/2022 Negative  Negative Final    PROTEIN 08/03/2022 Negative  Negative, 10 , 20  mg/dL Final    GLUCOSE 08/03/2022 Negative  Negative, 30  mg/dL Final    KETONES 08/03/2022 Negative  Negative, Trace mg/dL Final    BILIRUBIN 08/03/2022 Negative  Negative, 0.5 mg/dL Final    BLOOD 08/03/2022 0.06 (A)  Negative, 0.03 mg/dL Final    UROBILINOGEN 08/03/2022 Normal  Normal mg/dL Final    MUCOUS 08/03/2022 Occasional (A)  (none) /hpf Final    RBCS 08/03/2022 2  <4 /hpf Final    SQUAMOUS EPITHELIAL 08/03/2022 <1  <28 /hpf Final        Health Maintenance:  Health Maintenance   Topic Date Due    NonMedicare Preventative Exam  Never done    Hepatitis C screening  Never done    HIV Screening  Never done    Influenza Vaccine (1) Never done    Covid-19 Vaccine (1 - 2023-24 season) Never done    Depression Screening  10/14/2023    Adult Tdap-Td (7 - Td or Tdap) 12/19/2030    Hepatitis B Vaccine  Completed    Meningococcal Vaccine  Completed    Pneumococcal Vaccine, Age 59-64  Aged Out        Assessment & Plan   Problem List Items Addressed This Visit          Other    Morbid obesity with BMI of 45.0-49.9, adult (CMS [place]) - Primary     Patients current BMI is 47. Will try zepbound injections once weekly to help with  weight loss, in addition to continuing  with high protein and low carbohydrate diet and increasing exercise as tolerated 30 minutes 4-5 days weekly.   Patient educated on proper usage and side effects.   Cannot try wegovy first at this time because it is on national back order.          Relevant Medications    tirzepatide, weight loss, (ZEPBOUND) 2.5 mg/0.5 mL Subcutaneous Pen Injector     Other Visit Diagnoses       Encounter for lipid screening for cardiovascular disease        Relevant Orders    LIPID PANEL             Depression screening is negative. PHQ 2 Total: 0            Follow up:  Return in about 6 months (around 04/16/2023), or if symptoms worsen or fail to improve.    This note was partially created using voice recognition software and is inherently subject to errors including those of syntax and "sound alike " substitutions which may escape proof reading.  In such instances, original meaning may be extrapolated by contextual derivation.    Eleanora Neighbor, FNP-C  10/14/2022, 13:54

## 2022-10-14 NOTE — Assessment & Plan Note (Signed)
Patients current BMI is 47. Will try zepbound injections once weekly to help with weight loss, in addition to continuing with high protein and low carbohydrate diet and increasing exercise as tolerated 30 minutes 4-5 days weekly.   Patient educated on proper usage and side effects.   Cannot try wegovy first at this time because it is on national back order.

## 2022-10-19 ENCOUNTER — Encounter (INDEPENDENT_AMBULATORY_CARE_PROVIDER_SITE_OTHER): Payer: Self-pay

## 2022-11-18 ENCOUNTER — Telehealth (INDEPENDENT_AMBULATORY_CARE_PROVIDER_SITE_OTHER): Payer: Self-pay | Admitting: Internal Medicine

## 2022-11-18 NOTE — Telephone Encounter (Signed)
Pt is check on status of auth for [employer] prescribed for him in march 2024.

## 2022-11-18 NOTE — Telephone Encounter (Signed)
Attempted to prior auth the zepbound his insurance prescription coverage [employer] states his coverage is no longer active since patient notified of this by [employer]

## 2022-12-02 ENCOUNTER — Other Ambulatory Visit (INDEPENDENT_AMBULATORY_CARE_PROVIDER_SITE_OTHER): Payer: Self-pay | Admitting: NURSE PRACTITIONER

## 2022-12-02 MED ORDER — PHENTERMINE 37.5 MG TABLET
37.5000 mg | ORAL_TABLET | Freq: Every morning | ORAL | 0 refills | Status: DC
Start: 2022-12-02 — End: 2023-01-07

## 2023-01-07 ENCOUNTER — Other Ambulatory Visit (INDEPENDENT_AMBULATORY_CARE_PROVIDER_SITE_OTHER): Payer: Self-pay | Admitting: NURSE PRACTITIONER

## 2023-01-08 MED ORDER — PHENTERMINE 37.5 MG TABLET
37.5000 mg | ORAL_TABLET | Freq: Every morning | ORAL | 0 refills | Status: DC
Start: 2023-01-08 — End: 2023-08-23

## 2023-04-15 ENCOUNTER — Ambulatory Visit (INDEPENDENT_AMBULATORY_CARE_PROVIDER_SITE_OTHER): Payer: Self-pay | Admitting: NURSE PRACTITIONER

## 2023-06-30 ENCOUNTER — Other Ambulatory Visit: Payer: Self-pay

## 2023-06-30 ENCOUNTER — Emergency Department
Admission: EM | Admit: 2023-06-30 | Discharge: 2023-06-30 | Discharge status: Against Medical Advice | Payer: Commercial Managed Care - PPO

## 2023-06-30 DIAGNOSIS — Z5321 Procedure and treatment not carried out due to patient leaving prior to being seen by health care provider: Secondary | ICD-10-CM | POA: Insufficient documentation

## 2023-07-01 IMAGING — DX RIGHT KNEE
1 series · 3 of 3 positions shown · non-contrast
Comparison: None available.

﻿EXAM:     ABDULMOGHNI PROFESSIONAL READ RIGHT KNEE
INDICATION: Twisting injury.
TECHNIQUE: Three views.

[Series 1: lateral · U · 0.14mm/px · 3 of 3 slices shown]
[im 1/3]
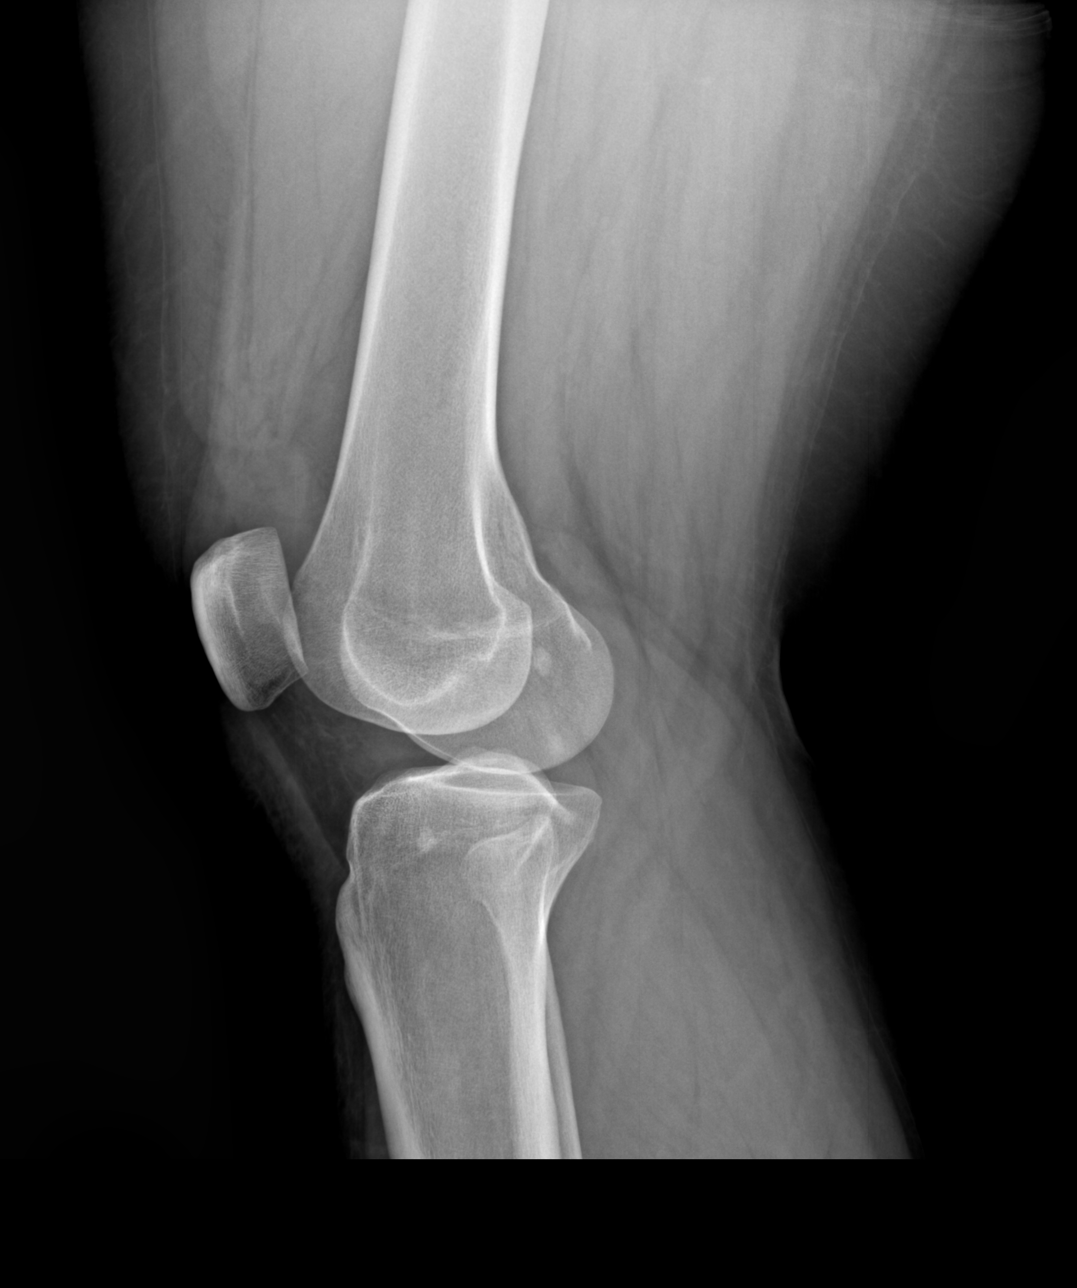
[im 2/3]
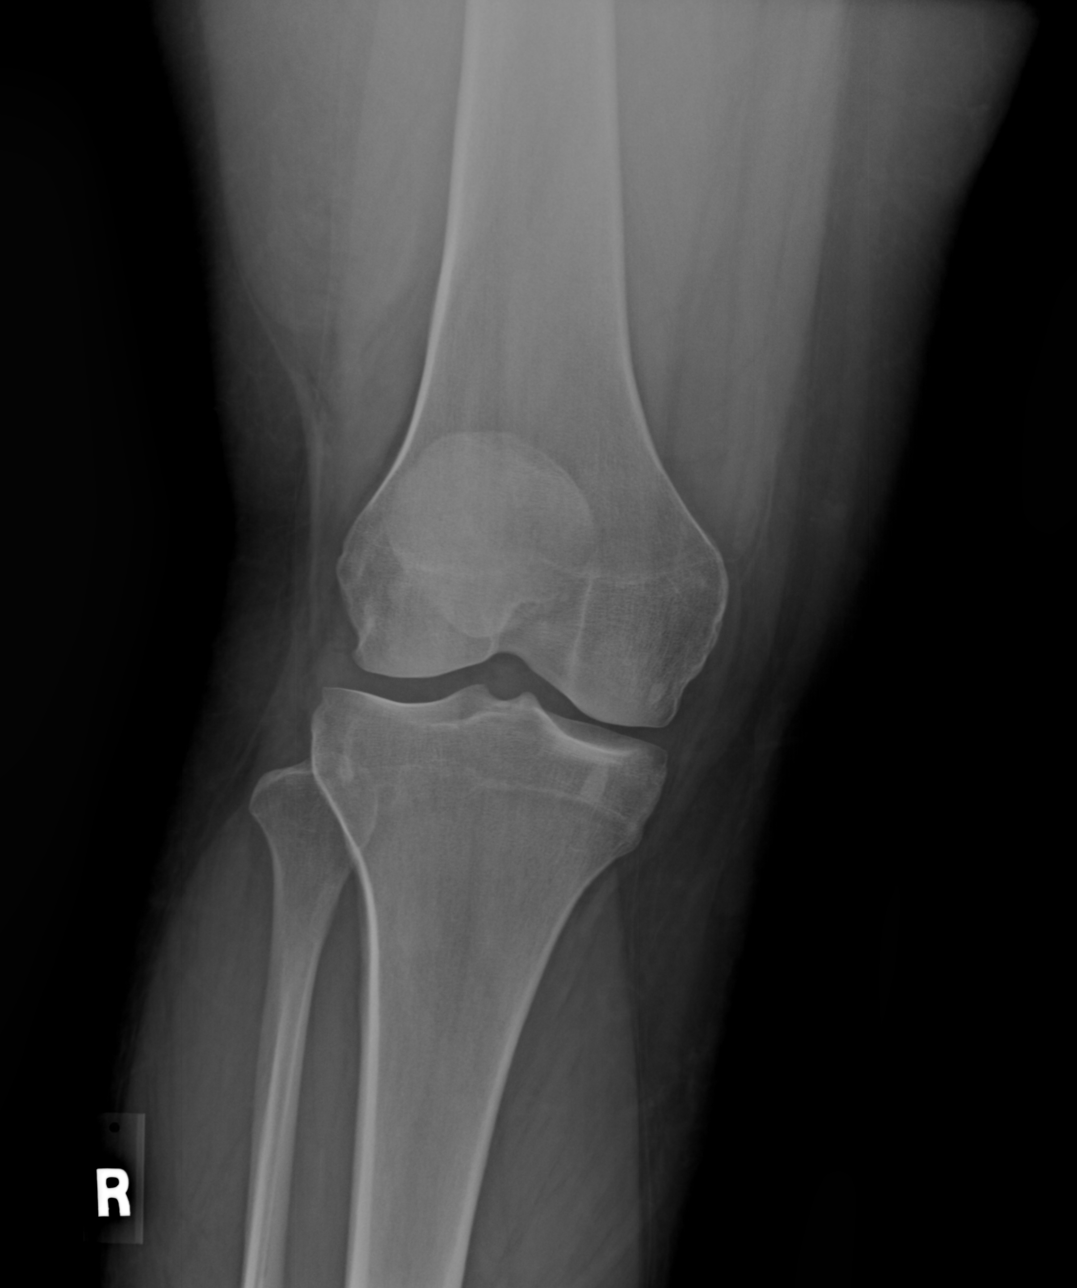
[im 3/3]
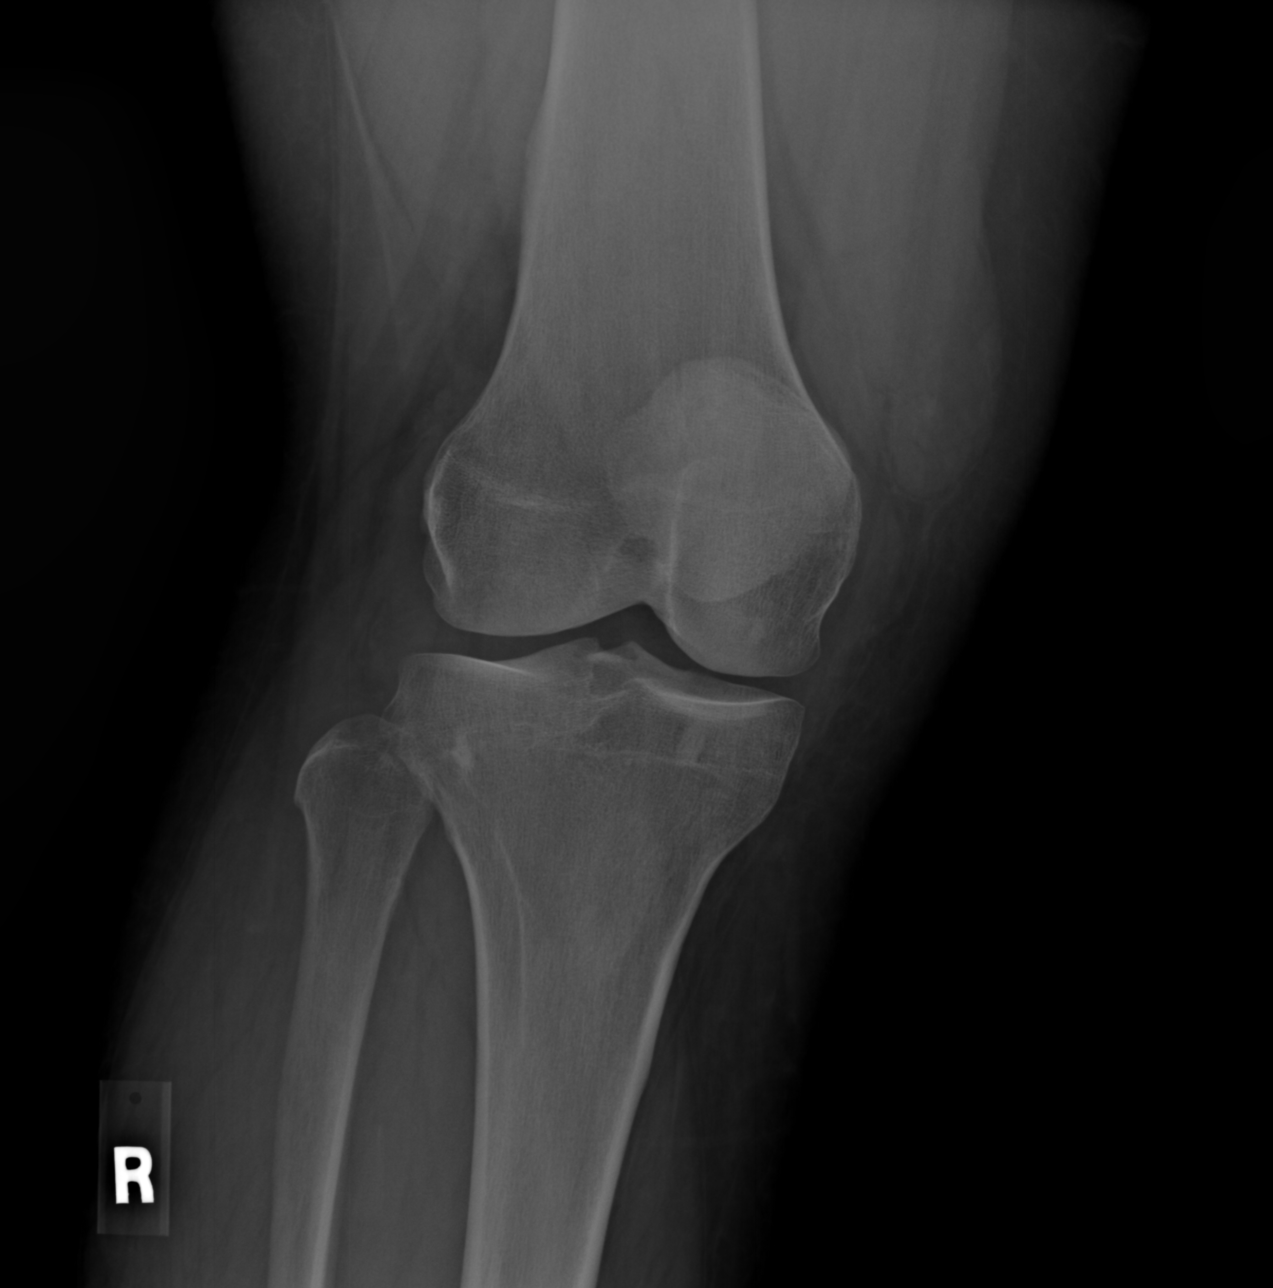

[3 of 3 positions shown; findings below may reference images not displayed]

FINDINGS: No acute fractures at the right knee.  Moderate-sized effusion is noted in the knee joint. Soft tissues are otherwise normal.
IMPRESSION: Moderate-sized effusion in the knee joint.  If symptoms are significant and persistent, MRI is indicated.

## 2023-07-13 ENCOUNTER — Other Ambulatory Visit: Payer: Self-pay

## 2023-07-13 ENCOUNTER — Encounter (INDEPENDENT_AMBULATORY_CARE_PROVIDER_SITE_OTHER): Payer: Self-pay | Admitting: NURSE PRACTITIONER

## 2023-07-13 ENCOUNTER — Ambulatory Visit (INDEPENDENT_AMBULATORY_CARE_PROVIDER_SITE_OTHER): Payer: Self-pay | Admitting: NURSE PRACTITIONER

## 2023-07-13 VITALS — BP 144/78 | HR 85 | Resp 18 | Ht 75.0 in | Wt 377.0 lb

## 2023-07-13 DIAGNOSIS — M25461 Effusion, right knee: Secondary | ICD-10-CM

## 2023-07-13 DIAGNOSIS — S8991XS Unspecified injury of right lower leg, sequela: Secondary | ICD-10-CM

## 2023-07-13 DIAGNOSIS — S8391XD Sprain of unspecified site of right knee, subsequent encounter: Secondary | ICD-10-CM

## 2023-07-13 DIAGNOSIS — M25561 Pain in right knee: Secondary | ICD-10-CM

## 2023-07-13 NOTE — Progress Notes (Signed)
INTERNAL MEDICINE, [facility]  [address]  [place] [address]      Acute Office Visit      NAME: Bradley Mendoza DOS: 07/13/2023   MRN: [id number] DOB: 1991/03/25     Chief Complaint: Knee Pain (Pt states he twisted his right knee 2 weeks ago. Has c/o of worsening pain. States he can not bend or straighten it without pain. Pt did see [facility] and got an xray as well as crutches. Has been taking motrin with no relief.)      History of Present Illness: Bradley Mendoza is a 32 y.o. male who comes in today with complaints of continued/worsening pain to right lateral knee after injuring appx 2 weeks ago. He states that he was cleaning something up in the floor for his child and he leg twisted side ways and he felt a tearing/burning sensation and then around his knee cap started swelling.   He has a knee xray performed at [facility], which he states just showed soft tissue swelling and was told it was just a sprain. He has been wrapping his knee, using ice, and trying to prop it up as much as possible but states that it does not feel any better and continues to have trouble with weight bearing/swelling and the pain is severe.            No other acute concerns or complaints at this time.    Medical History     I have reviewed and updated as appropriate the past medical, surgical, family, and social history today:  Medical History/Surgical History/Family History/Social History    Allergies:  No Known Allergies    Medications:  Current Outpatient Medications   Medication Sig    oxyCODONE-acetaminophen (PERCOCET) 5-325 mg Oral Tablet Take 1 Tablet by mouth Every 6 hours as needed (Patient not taking: Reported on 10/14/2022)    phentermine (ADIPEX-P) 37.5 mg Oral Tablet Take 1 Tablet (37.5 mg total) by mouth Every morning before breakfast Indications: weight loss management for an obese person       Problem List:  Patient Active Problem List    Diagnosis Date Noted    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   Constitutional:       General: He is not in acute distress.     Appearance: Normal appearance. He is obese. He is not ill-appearing or diaphoretic.   HENT:      Head: Normocephalic and atraumatic.   Cardiovascular:      Rate and Rhythm: Normal rate and regular rhythm.      Heart sounds: S1 normal and S2 normal. No murmur heard.  Pulmonary:      Effort: Pulmonary effort is normal.      Breath sounds: Normal breath sounds.   Abdominal:      General: Bowel sounds are normal.      Palpations: Abdomen is soft.   Musculoskeletal:      Cervical back: Normal range of motion.      Right knee:  Swelling and effusion present. Decreased range of motion. Tenderness present over the lateral joint line.   Skin:     General: Skin is warm and dry.      Capillary Refill: Capillary refill takes less than 2 seconds.   Neurological:      General: No focal deficit present.      Mental Status: He is alert.   Psychiatric:         Mood and Affect: Mood normal.            Assessment/Plan     Diagnosis/Plan:  Problem List Items Addressed This Visit    None  Visit Diagnoses       Pain and swelling of right knee    -  Primary    Relevant Orders    MRI KNEE RIGHT WO CONTRAST    Right knee pain        Relevant Orders    MRI KNEE RIGHT WO CONTRAST    Right knee injury, sequela        Relevant Orders    MRI KNEE RIGHT WO CONTRAST    Pain in lateral portion of right knee        Relevant Orders    MRI KNEE RIGHT WO CONTRAST           Records requested for knee xray from [facility].   Patient instructed to continue with brace for knee for support, may continue with ice/heat as needed, may use tylenol/motrin as needed for pain. MRI ordered to  r/o further etiology. Further clinical course will be based upon results and patient will be contacted.   If othopedic services needed, patient request referral to be sent to Dr. Connye Burkitt at [facility].     Encounter Medications and Orders  Orders Placed This Encounter    MRI KNEE RIGHT WO CONTRAST       Return if symptoms worsen or fail to improve.      Macario Golds, FNP-C  07/13/2023 09:55     This note was partially created using M*Modal fluency direct system (voice recognition software ) and is inherently subject to errors including those of syntax and "sound- alike" substitutions which may escape proofreading.  In such instances, original meaning may be extrapolated by contextual derivation.Marland Kitchen

## 2023-07-26 ENCOUNTER — Emergency Department
Admission: EM | Admit: 2023-07-26 | Discharge: 2023-07-26 | Disposition: A | Discharge status: Home / Self Care | Payer: Commercial Managed Care - PPO | Source: Home / Self Care | Attending: Emergency Medicine | Admitting: Emergency Medicine

## 2023-07-26 ENCOUNTER — Other Ambulatory Visit: Payer: Self-pay

## 2023-07-26 ENCOUNTER — Emergency Department (HOSPITAL_BASED_OUTPATIENT_CLINIC_OR_DEPARTMENT_OTHER): Payer: Commercial Managed Care - PPO

## 2023-07-26 ENCOUNTER — Encounter (HOSPITAL_BASED_OUTPATIENT_CLINIC_OR_DEPARTMENT_OTHER): Payer: Self-pay

## 2023-07-26 DIAGNOSIS — S8391XA Sprain of unspecified site of right knee, initial encounter: Secondary | ICD-10-CM

## 2023-07-26 DIAGNOSIS — W19XXXA Unspecified fall, initial encounter: Secondary | ICD-10-CM

## 2023-07-26 DIAGNOSIS — S86911A Strain of unspecified muscle(s) and tendon(s) at lower leg level, right leg, initial encounter: Secondary | ICD-10-CM | POA: Insufficient documentation

## 2023-07-26 DIAGNOSIS — M25461 Effusion, right knee: Secondary | ICD-10-CM | POA: Insufficient documentation

## 2023-07-26 DIAGNOSIS — M1711 Unilateral primary osteoarthritis, right knee: Secondary | ICD-10-CM | POA: Insufficient documentation

## 2023-07-26 DIAGNOSIS — Z6841 Body Mass Index (BMI) 40.0 and over, adult: Secondary | ICD-10-CM | POA: Insufficient documentation

## 2023-07-26 DIAGNOSIS — X501XXA Overexertion from prolonged static or awkward postures, initial encounter: Secondary | ICD-10-CM | POA: Insufficient documentation

## 2023-07-26 DIAGNOSIS — E669 Obesity, unspecified: Secondary | ICD-10-CM | POA: Insufficient documentation

## 2023-07-26 MED ORDER — KETOROLAC 60 MG/2 ML INTRAMUSCULAR SOLUTION
60.0000 mg | INTRAMUSCULAR | Status: AC
Start: 2023-07-26 — End: 2023-07-26
  Administered 2023-07-26: 60 mg via INTRAMUSCULAR

## 2023-07-26 MED ORDER — METHYLPREDNISOLONE ACETATE 80 MG/ML SUSPENSION FOR INJECTION
INTRAMUSCULAR | Status: AC
Start: 2023-07-26 — End: 2023-07-26
  Filled 2023-07-26: qty 1

## 2023-07-26 MED ORDER — METHYLPREDNISOLONE ACETATE 80 MG/ML SUSPENSION FOR INJECTION
80.0000 mg | Freq: Once | INTRAMUSCULAR | Status: AC
Start: 2023-07-26 — End: 2023-07-26
  Administered 2023-07-26: 80 mg via INTRAMUSCULAR

## 2023-07-26 MED ORDER — KETOROLAC 60 MG/2 ML INTRAMUSCULAR SOLUTION
INTRAMUSCULAR | Status: AC
Start: 2023-07-26 — End: 2023-07-26
  Filled 2023-07-26: qty 2

## 2023-07-26 MED ORDER — KETOROLAC 10 MG TABLET
10.0000 mg | ORAL_TABLET | Freq: Four times a day (QID) | ORAL | 0 refills | Status: DC | PRN
Start: 2023-07-26 — End: 2024-02-28

## 2023-07-26 NOTE — ED Nurses Note (Signed)
Immobilizer in stock was too small. Dr informed and ACE wrap applied to right knee. Patient aware he needs to have an MRI. Patient states he had an MRI scheduled Friday but was unable to go. Suggested since he has an order to call [facility] and see if it could be rescheduled.

## 2023-07-26 NOTE — ED Nurses Note (Signed)
Patient discharged home all instructions gone over with patient including medications with opportunity to ask questions. Patient verbalized understanding and was taken off unit in wheelchair and assisted into the car.

## 2023-07-26 NOTE — ED Triage Notes (Signed)
About 1 month ago twisted and knee swelled   went to dr and stated the knee cap was displaced   "has been popping in and out of place" currently has popped out and move it  no Wt bearing  increased pain  was supposed to get MRI last Friday was unable to go

## 2023-07-26 NOTE — ED Provider Notes (Signed)
[facility], [facility] - Emergency Department  ED Primary Provider Note  History of Present Illness   Chief Complaint   Patient presents with    Knee Pain     RT Knee     Bradley Mendoza is a 32 y.o. male who had concerns including Knee Pain.  Arrival: The patient arrived by Car patient twisted his right knee a month ago and states his patella was dislocated medially.  Patient states that it has happened 3 or 4 times since then.  Mostly complaining of pain on the lateral aspect of his right knee and increased pain when he weight bears on the knee itself.  Denies any lower back pain or hip pain.    HPI  Review of Systems   Review of Systems   Constitutional:  Positive for activity change and appetite change. Negative for chills and fever.   HENT:  Negative for ear pain and sore throat.    Eyes:  Negative for pain and visual disturbance.   Respiratory:  Negative for cough and shortness of breath.    Cardiovascular:  Negative for chest pain and palpitations.   Gastrointestinal:  Negative for abdominal pain and vomiting.   Genitourinary:  Negative for dysuria and hematuria.   Musculoskeletal:  Positive for arthralgias, gait problem, joint swelling and myalgias. Negative for back pain.   Skin:  Negative for color change and rash.   Neurological:  Negative for seizures and syncope.   All other systems reviewed and are negative.     Historical Data   History Reviewed This Encounter:     Physical Exam   ED Triage Vitals [07/26/23 0741]   BP (Non-Invasive) 135/87   Heart Rate 83   Respiratory Rate 18   Temperature 37.2 C (98.9 F)   SpO2 95 %   Weight (!) 171 kg (377 lb)   Height 1.905 m (6\' 3" )     Physical Exam  Vitals and nursing note reviewed.   Constitutional:       General: He is not in acute distress.     Appearance: He is well-developed. He is obese.   HENT:      Head: Normocephalic and atraumatic.      Right Ear: External ear normal.      Left Ear: External ear normal.      Nose: Nose normal.       Mouth/Throat:      Mouth: Mucous membranes are dry.   Eyes:      Extraocular Movements: Extraocular movements intact.      Conjunctiva/sclera: Conjunctivae normal.      Pupils: Pupils are equal, round, and reactive to light.   Cardiovascular:      Rate and Rhythm: Normal rate and regular rhythm.      Pulses: Normal pulses.      Heart sounds: Normal heart sounds. No murmur heard.  Pulmonary:      Effort: Pulmonary effort is normal. No respiratory distress.      Breath sounds: Normal breath sounds.   Abdominal:      General: Bowel sounds are normal.      Palpations: Abdomen is soft.      Tenderness: There is no abdominal tenderness.   Musculoskeletal:         General: Swelling and tenderness present.      Cervical back: Normal range of motion and neck supple.      Comments: Positive swelling with patella effusion on the right knee.  Positive  tenderness over the lateral collateral ligament area.   Skin:     General: Skin is warm and dry.      Capillary Refill: Capillary refill takes less than 2 seconds.   Neurological:      General: No focal deficit present.      Mental Status: He is alert and oriented to person, place, and time.   Psychiatric:         Mood and Affect: Mood normal.         Behavior: Behavior normal.         Thought Content: Thought content normal.       Patient Data   Labs Ordered/Reviewed - No data to display  XR KNEE RIGHT 4 OR MORE VIEW   Final Result by Edi, Radresults In (12/30 0846)   DEGENERATIVE OSTEOARTHROSIS. NO ACUTE FINDINGS EXCEPT FOR A POSSIBLE SMALL JOINT EFFUSION.                Radiologist location ID: [id number]           Medical Decision Making        Medical Decision Making  Patient is 32 year old white male complaining pain in his right knee for proximally 1 month.  He states he had an appointment for an MRI of his knee several days ago but could not make it because he hit something due.  Patient states that his kneecap keeps popping to the side making the pain worse.  He is  complaining of pain on the lateral aspect of his right knee.  He states the kneecap has dislocated 3 or 4 times.  Patient states it is worse when he tries to weightbear on the leg.  Patient will have an x-ray of his knee.  He will then be placed in a knee immobilizer and discharged home.  He will follow up with his PMD for another MRI to see whether he has got ligament damage or not.  Patient will be treated for results and then follow up with his PMD in 3 or 4 days.    Amount and/or Complexity of Data Reviewed  Radiology: ordered.    Risk  Prescription drug management.             Medications Administered in the ED   ketorolac (TORADOL) 60mg /2 mL IM injection (has no administration in time range)   methylPREDNISolone acetate (DEPO-medrol) 80 mg/mL injection (has no administration in time range)     Clinical Impression   Knee strain, right, initial encounter (Primary)   Knee effusion, right   Fall       Disposition: Discharged               Clinical Impression   Knee strain, right, initial encounter (Primary)   Knee effusion, right   Fall       Current Discharge Medication List        START taking these medications    Details   ketorolac tromethamine (TORADOL) 10 mg Oral Tablet Take 1 Tablet (10 mg total) by mouth Every 6 hours as needed for Pain  Qty: 20 Tablet, Refills: 0

## 2023-07-29 ENCOUNTER — Encounter (INDEPENDENT_AMBULATORY_CARE_PROVIDER_SITE_OTHER): Payer: Self-pay | Admitting: NURSE PRACTITIONER

## 2023-07-29 DIAGNOSIS — S8991XS Unspecified injury of right lower leg, sequela: Secondary | ICD-10-CM

## 2023-07-29 DIAGNOSIS — M25561 Pain in right knee: Secondary | ICD-10-CM

## 2023-07-29 IMAGING — MR MRI KNEE RT W/O CONTRAST
5 series · 36 of 40 positions shown · IV contrast (gadolinium)
Comparison: Radiographs dated 07/01/2023.

﻿EXAM:  31334   MRI KNEE RT W/O CONTRAST
INDICATION: 32-year-old with right knee pain and swelling.  Twisting injury. Lateral knee pain.
TECHNIQUE: Multiplanar, multisequential MRI of the right knee was performed without gadolinium contrast.

[Series 5: PD fat-sat · axial · right · 4.5mm · 0.41mm/px · z∈[-42,+83]mm · 8 of 26 slices shown (1 of 3)]
[im 1/26]
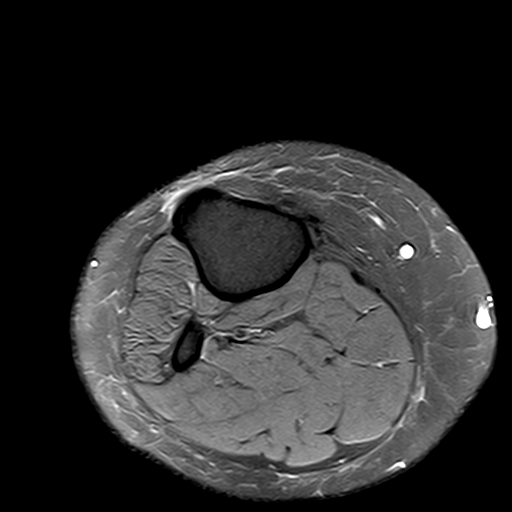
[im 4/26]
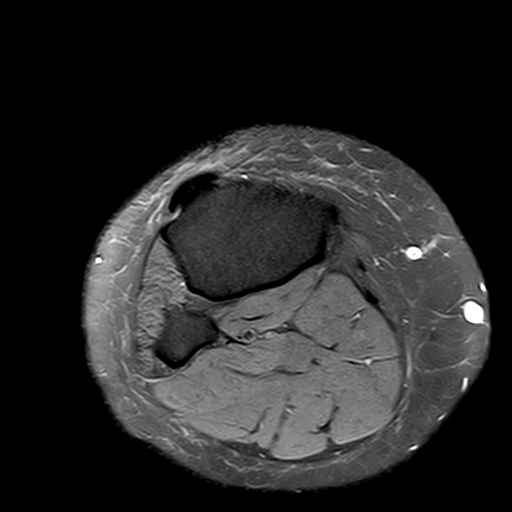
[im 8/26]
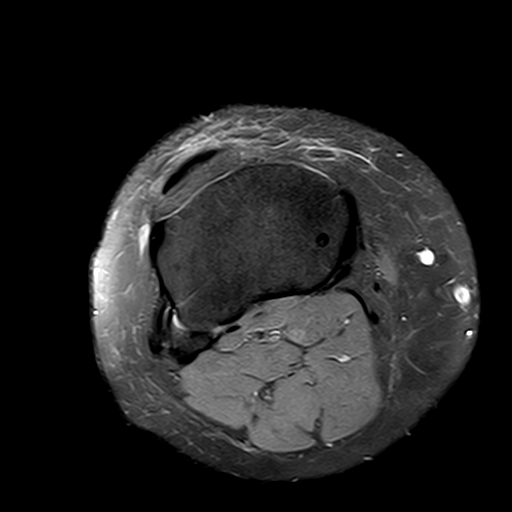
[im 11/26]
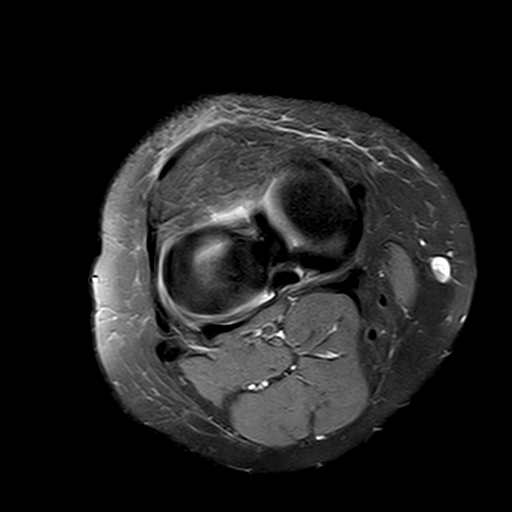
[im 15/26]
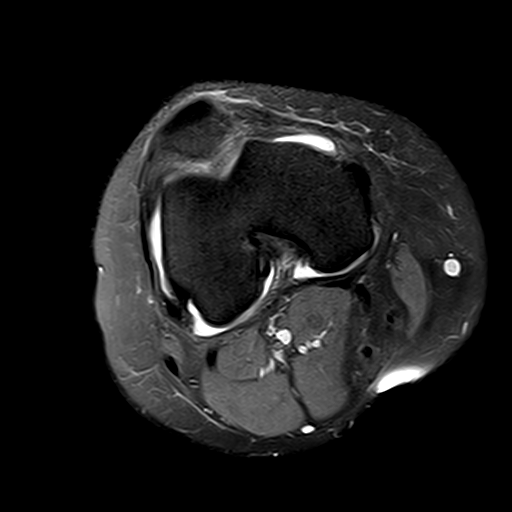
[im 18/26]
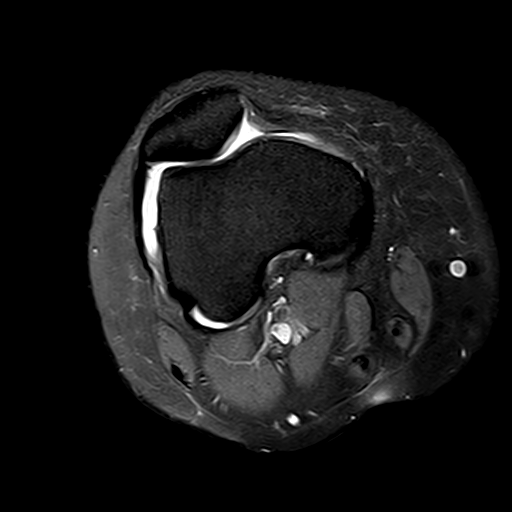
[im 22/26]
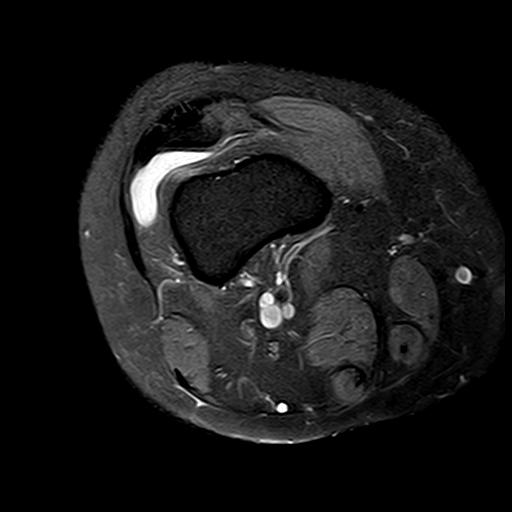
[im 26/26]
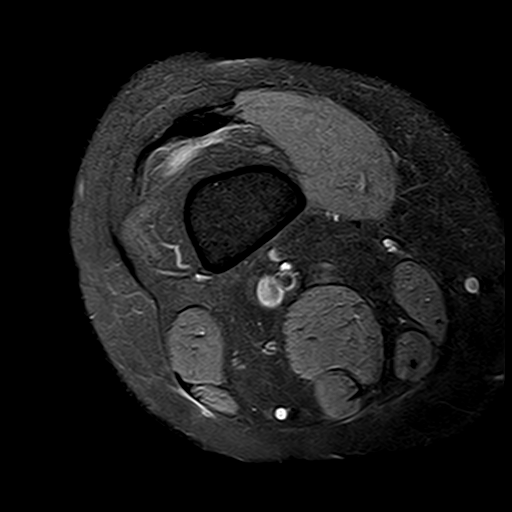

[Series 6: PD fat-sat · sagittal · right · 4.0mm · 0.59mm/px · 7 of 26 slices shown (2 of 3)]
[im 1/26]
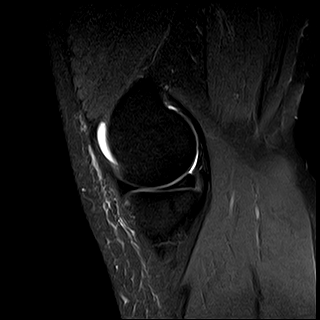
[im 5/26]
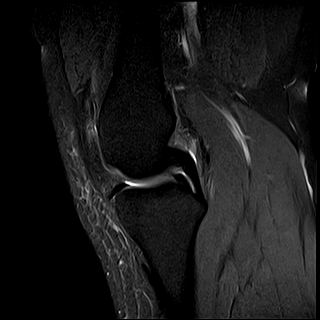
[im 9/26]
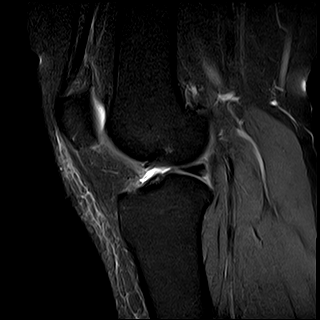
[im 13/26]
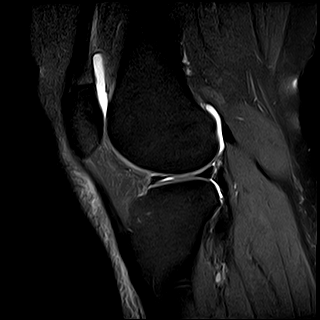
[im 17/26]
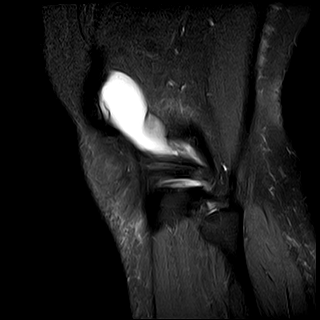
[im 21/26]
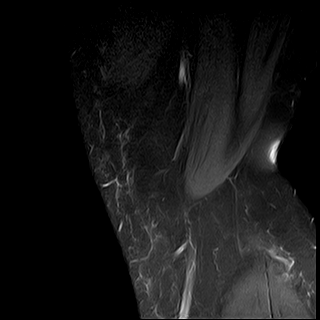
[im 26/26]
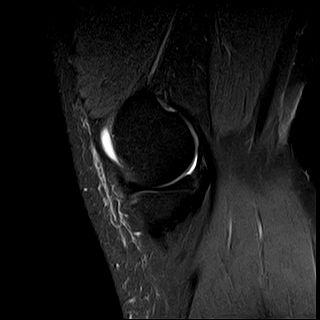

[Series 7: T1 · sagittal · right · 4.0mm · 0.59mm/px · 7 of 26 slices shown]
[im 1/26]
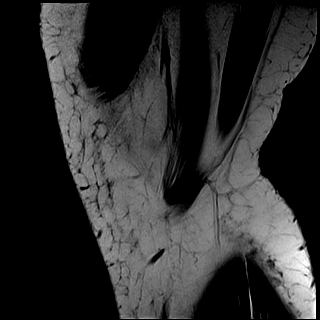
[im 5/26]
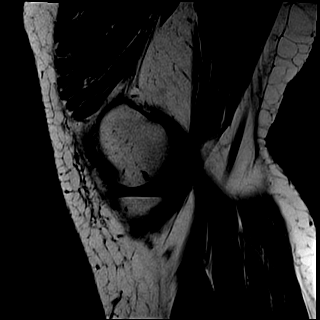
[im 9/26]
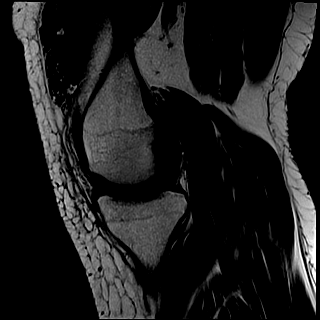
[im 13/26]
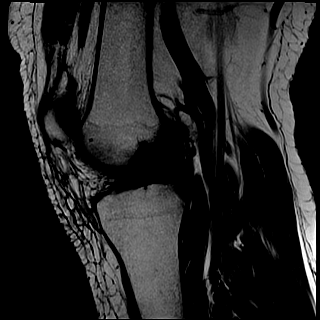
[im 17/26]
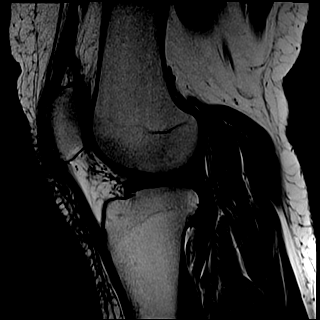
[im 21/26]
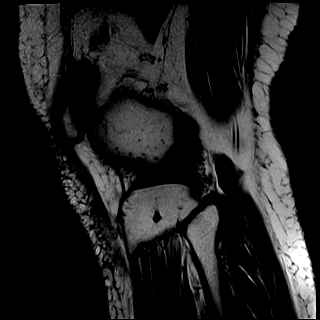
[im 26/26]
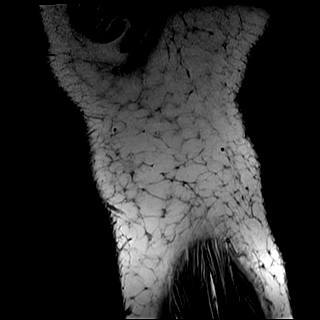

[Series 8: STIR · coronal · right · 4.0mm · 0.66mm/px · 5 of 32 slices shown]
[im 1/32]
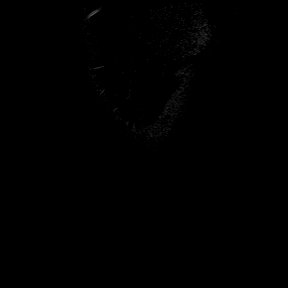
[im 4/32]
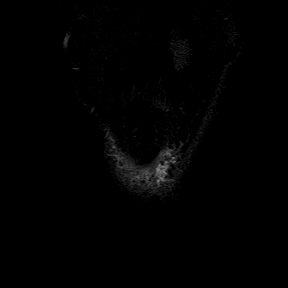
[im 8/32]
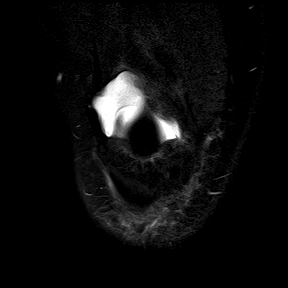
[im 12/32]
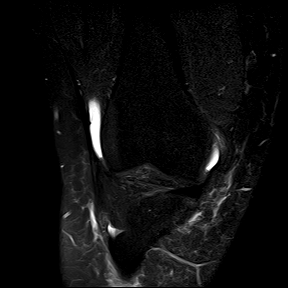
[im 20/32]
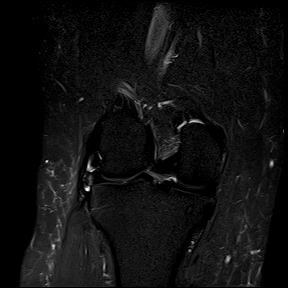

[Series 9: PD fat-sat · coronal · right · 4.0mm · 0.59mm/px · 9 of 32 slices shown (3 of 3)]
[im 1/32]
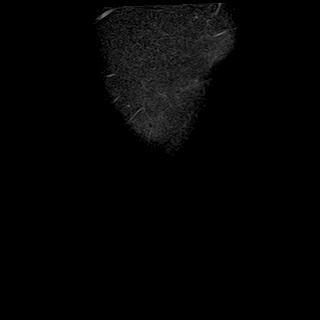
[im 4/32]
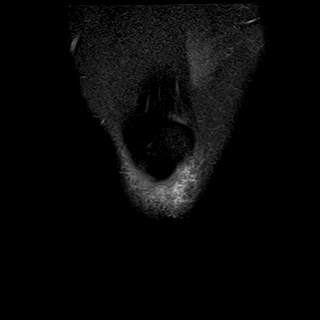
[im 8/32]
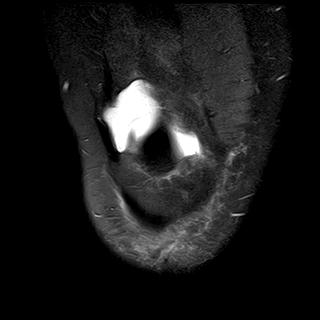
[im 12/32]
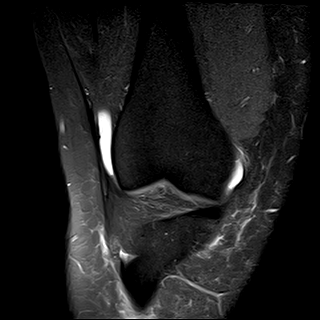
[im 16/32]
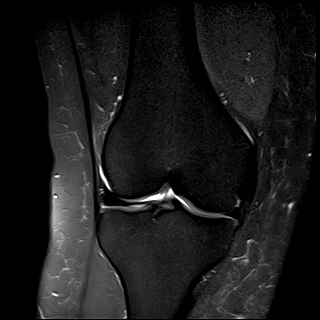
[im 20/32]
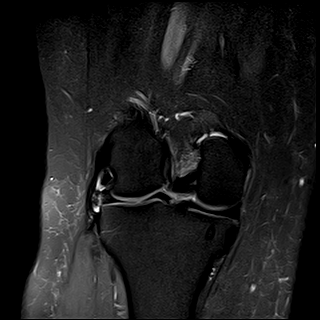
[im 24/32]
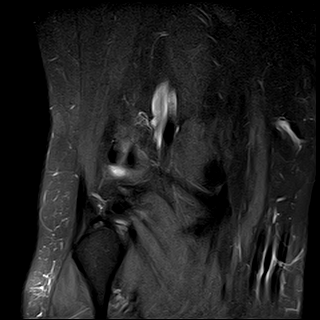
[im 28/32]
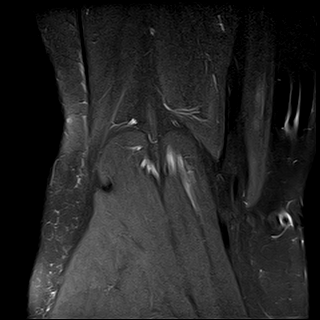
[im 32/32]
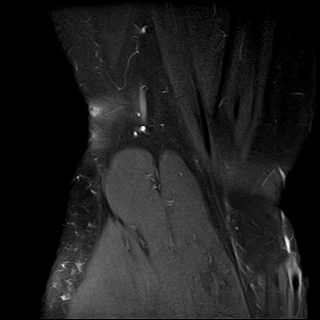

[36 of 40 positions shown; findings below may reference images not displayed]

FINDINGS: No acute or focal bone changes are seen at the right knee.

The lateral meniscus and lateral articular cartilage do not show acute lesions.

Anterior and posterior cruciate ligaments are intact.  No acute findings of the medial meniscus and medial articular cartilage are seen.

Collateral ligaments are intact.  The quadriceps tendon and patellar tendon are intact. Mild infrapatellar bursitis and inflammatory changes are noted. Small effusion is noted in the knee joint.
IMPRESSION: 1. No acute bony lesions at the right knee.

2. No internal derangement of the menisci, cruciate ligaments and collateral ligaments.

3. Mild infrapatellar bursitis. Small effusion in the knee joint.

## 2023-08-07 ENCOUNTER — Ambulatory Visit (HOSPITAL_COMMUNITY): Payer: Self-pay

## 2023-08-18 ENCOUNTER — Ambulatory Visit (INDEPENDENT_AMBULATORY_CARE_PROVIDER_SITE_OTHER): Payer: Commercial Managed Care - PPO | Admitting: NURSE PRACTITIONER

## 2023-08-23 ENCOUNTER — Encounter (INDEPENDENT_AMBULATORY_CARE_PROVIDER_SITE_OTHER): Payer: Self-pay | Admitting: NURSE PRACTITIONER

## 2023-08-23 ENCOUNTER — Ambulatory Visit (INDEPENDENT_AMBULATORY_CARE_PROVIDER_SITE_OTHER): Payer: Commercial Managed Care - PPO | Admitting: NURSE PRACTITIONER

## 2023-08-23 ENCOUNTER — Other Ambulatory Visit: Payer: Self-pay

## 2023-08-23 VITALS — BP 130/77 | HR 71 | Resp 16 | Ht 75.0 in | Wt 364.0 lb

## 2023-08-23 DIAGNOSIS — Z0000 Encounter for general adult medical examination without abnormal findings: Secondary | ICD-10-CM

## 2023-08-23 DIAGNOSIS — Z136 Encounter for screening for cardiovascular disorders: Secondary | ICD-10-CM

## 2023-08-23 DIAGNOSIS — Z13220 Encounter for screening for lipoid disorders: Secondary | ICD-10-CM

## 2023-08-23 DIAGNOSIS — Z6841 Body Mass Index (BMI) 40.0 and over, adult: Secondary | ICD-10-CM

## 2023-08-23 DIAGNOSIS — Z13228 Encounter for screening for other metabolic disorders: Secondary | ICD-10-CM

## 2023-08-23 DIAGNOSIS — M25561 Pain in right knee: Secondary | ICD-10-CM

## 2023-08-23 MED ORDER — PHENTERMINE 37.5 MG TABLET
37.5000 mg | ORAL_TABLET | Freq: Every morning | ORAL | 0 refills | Status: DC
Start: 2023-08-23 — End: 2024-02-28

## 2023-08-23 NOTE — Progress Notes (Signed)
[facility], [facility]  [address]  [place] [address]       Name: JAVEN HINDERLITER MRN:  [id number]   Date: 08/23/2023 Age: 33 y.o.       OUTPATIENT PROGRESS NOTE    Subjective:   Patient ID:  Mr. Norkus is a pleasant 33 y.o. male.    Chief Complaint: Knee Pain (Right knee pain) and Annual Exam      History of Present Illness:  Pt presents for a well adult exam.      Diet:  restricting intake of the following: following low carb diet currently.   Activity level: sedentary lifestyle  Sleep: poor sleep hygiene  Compliant with taking medications: Yes    Patient Active Problem List   Diagnosis    Morbid obesity with BMI of 45.0-49.9, adult (CMS HCC)       Acute issues:  complaints - continues to experience right knee pain     The history is provided by the patient.      Allergies:   No Known Allergies      Medications:   ketorolac tromethamine (TORADOL) 10 mg Oral Tablet, Take 1 Tablet (10 mg total) by mouth Every 6 hours as needed for Pain  oxyCODONE-acetaminophen (PERCOCET) 5-325 mg Oral Tablet, Take 1 Tablet by mouth Every 6 hours as needed (Patient not taking: Reported on 10/14/2022)  phentermine (ADIPEX-P) 37.5 mg Oral Tablet, Take 1 Tablet (37.5 mg total) by mouth Every morning before breakfast Indications: weight loss management for an obese person    No facility-administered medications prior to visit.        Immunization History:     There is no immunization history on file for this patient.      Past Medical History:     Past Medical History:   Diagnosis Date    Asthma     B12 deficiency     Esophageal reflux     Vitamin D deficiency           Past Surgical History:     Past Surgical History:   Procedure Laterality Date    HX TONSILLECTOMY N/A           Family History:     Family Medical History:    None            Social History:     Social History     Socioeconomic History    Marital status: Married     Spouse name: Financial planner    Number of children: Not on file    Years of  education: Not on file    Highest education level: Not on file   Occupational History    Not on file   Tobacco Use    Smoking status: Never    Smokeless tobacco: Never   Substance and Sexual Activity    Alcohol use: Not Currently     Alcohol/week: 1.0 standard drink of alcohol     Types: 1 Cans of beer per week     Comment: occasionally    Drug use: Not Currently    Sexual activity: Yes   Other Topics Concern    Not on file   Social History Narrative    Not on file     Social Determinants of Health     Financial Resource Strain: Not on file   Transportation Needs: Not on file   Social Connections: Not on file   Intimate Partner Violence: Not  on file   Housing Stability: Not on file            Review of Systems:   Review of System as discussed in HPI   Objective:   Vitals:    Vitals:    08/23/23 1215   BP: 130/77   Pulse: 71   Resp: 16   SpO2: 94%   Weight: (!) 165 kg (364 lb)   Height: 1.905 m (6\' 3" )   BMI: 45.5          Body mass index is 45.5 kg/m.  Physical Exam  Vitals and nursing note reviewed.   Constitutional:       General: He is not in acute distress.     Appearance: Normal appearance. He is well-developed and well-groomed. He is obese. He is not ill-appearing or diaphoretic.   HENT:      Head: Normocephalic and atraumatic.   Cardiovascular:      Rate and Rhythm: Normal rate and regular rhythm.      Pulses:           Radial pulses are 2+ on the right side and 2+ on the left side.      Heart sounds: S1 normal and S2 normal. No murmur heard.  Pulmonary:      Effort: Pulmonary effort is normal.      Breath sounds: Normal breath sounds.   Abdominal:      General: Bowel sounds are normal.      Palpations: Abdomen is soft.   Musculoskeletal:         General: Normal range of motion.      Cervical back: Normal range of motion.      Right lower leg: No edema.      Left lower leg: No edema.   Skin:     General: Skin is warm and dry.      Capillary Refill: Capillary refill takes less than 2 seconds.   Neurological:       General: No focal deficit present.      Mental Status: He is alert and oriented to person, place, and time.   Psychiatric:         Mood and Affect: Mood normal.         Behavior: Behavior is cooperative.          Labs:     No visits with results within 6 Month(s) from this visit.   Latest known visit with results is:   Office Visit on 10/14/2022   Component Date Value Ref Range Status    CHOLESTEROL 10/14/2022 224 (H)  <200 mg/dL Final    TRIGLYCERIDES 10/14/2022 203 (H)  <=150 mg/dL Final    HDL CHOL 09/60/4540 37  23 - 92 mg/dL Final    LDL CALC 98/05/9146 146 (H)  0 - 100 mg/dL Final    VLDL CALC 82/95/6213 41  0 - 50 mg/dL Final    CHOL/HDL RATIO 10/14/2022 6.1   Final       Assessment & Plan:       ICD-10-CM    1. Preventative health care  Z00.00 CBC/DIFF     COMPREHENSIVE METABOLIC PNL, FASTING     LIPID PANEL  Preventive health care discussed.   Physical exam WNL except as noted above.   Routine monitoring labs ordered, will evaluate for any significant abnormalities and discuss changes with patient to current management as necessary based on results.         2. Morbid  obesity with BMI of 45.0-49.9, adult (CMS HCC)  E66.01 COMPREHENSIVE METABOLIC PNL, FASTING    Z68.42 LIPID PANEL     phentermine (ADIPEX-P) 37.5 mg Oral Tablet      3. Right knee pain  M25.561       4. Encounter for lipid screening for cardiovascular disease  Z13.220 CBC/DIFF    Z13.6 LIPID PANEL      5. Encounter for screening for metabolic disorder  Z13.228 CBC/DIFF     COMPREHENSIVE METABOLIC PNL, FASTING        Orders Placed This Encounter    CBC/DIFF    COMPREHENSIVE METABOLIC PNL, FASTING    LIPID PANEL    phentermine (ADIPEX-P) 37.5 mg Oral Tablet               Health Maintenance   Topic Date Due    Hepatitis C screening  Never done    HIV Screening  Never done    Influenza Vaccine (1) Never done    Covid-19 Vaccine (1 - 2024-25 season) Never done    Depression Screening  10/14/2023    NonMedicare Preventative Exam  08/22/2024     Adult Tdap-Td (5 - Tdap) 12/19/2030    Hepatitis B Vaccine  Completed       Return in about 6 months (around 02/20/2024), or if symptoms worsen or fail to improve.    The patient has been educated and verbalized understanding regarding the services provided during this visit.    Macario Golds, FNP-C @ on 08/23/23 at 12:52.

## 2023-09-15 ENCOUNTER — Telehealth (INDEPENDENT_AMBULATORY_CARE_PROVIDER_SITE_OTHER): Payer: Self-pay | Admitting: Internal Medicine

## 2023-09-15 MED ORDER — OSELTAMIVIR 75 MG CAPSULE
75.0000 mg | ORAL_CAPSULE | Freq: Every day | ORAL | 0 refills | Status: AC
Start: 2023-09-15 — End: 2023-09-20

## 2024-02-17 ENCOUNTER — Ambulatory Visit (INDEPENDENT_AMBULATORY_CARE_PROVIDER_SITE_OTHER): Payer: Self-pay | Admitting: NURSE PRACTITIONER

## 2024-02-23 NOTE — OR PreOp (Signed)
 AVS printed and reviwed with PT and spouse. PT GCS 15, denied any further concerns. Belongings brought to bedside by wife. Pt dressed, IVS removed, transferred to wheelchair to be discharged.

## 2024-02-23 NOTE — OR Surgeon (Signed)
 Pt. Name: Bradley Mendoza, Bradley Mendoza          MRN: [id number]          Date of Birth: 03-Sep-1990      DATE OF SERVICE:  02/23/2024    PREOPERATIVE DIAGNOSES:  1.  Right shoulder recurrent labral tear.    POSTOPERATIVE DIAGNOSES:   1.  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  Right shoulder posterior inferior labral tear.    PROCEDURES PERFORMED:    1.  Right shoulder arthroscopic anterior inferior labral repair (Bankart repair).  2.  Right shoulder arthroscopic posterior labral repair.    SURGEON:  Morene Gavel, MD.    ASSISTANT:  Prentice Has, MD.    ANESTHESIA:  GETA, nerve block.    INTRAVENOUS FLUIDS:  Please see anesthesia record.    ESTIMATED BLOOD LOSS:     IMPLANTS:    Implant Name Type Inv. Item Serial No. Manufacturer Lot No. LRB No. Used Action   ANCHOR SUTURETAK FIBERWIRE L14.5 MM OD3 MM BIOCOMPOSITE 2 SUTURE - [id number] Joint ANCHOR SUTURETAK FIBERWIRE L14.5 MM OD3 MM BIOCOMPOSITE 2 SUTURE  [employer]WD [id number] Right 1 Implanted   ANCHOR SUTURETAK FIBERWIRE L14.5 MM OD3 MM BIOCOMPOSITE 2 SUTURE - [id number] Joint ANCHOR SUTURETAK FIBERWIRE L14.5 MM OD3 MM BIOCOMPOSITE 2 SUTURE  [employer]WD [id number] Right 1 Implanted   ANCHOR SUTURETAK FIBERWIRE L14.5 MM OD3 MM BIOCOMPOSITE 2 SUTURE - [id number] Joint ANCHOR SUTURETAK FIBERWIRE L14.5 MM OD3 MM BIOCOMPOSITE 2 SUTURE  [employer]WD [id number] Right 1 Implanted   ANCHOR KNOTLESS FIBERTAK OD1.8 MM 2 GEN2 SUTURE - [id number]  ANCHOR KNOTLESS FIBERTAK OD1.8 MM 2 GEN2 SUTURE  [employer]WD [id number] Right 1 Implanted   ANCHOR SUTURETAK FIBERWIRE L14.5 MM OD3 MM BIOCOMPOSITE 2 SUTURE - [id number] Joint ANCHOR SUTURETAK FIBERWIRE L14.5 MM OD3 MM BIOCOMPOSITE 2 SUTURE [id number] [employer]WD [id number] Right 1 Implanted   ANCHOR SUTURETAK FIBERWIRE L14.5 MM OD3 MM BIOCOMPOSITE 2 SUTURE - [id number] Joint ANCHOR SUTURETAK FIBERWIRE L14.5 MM OD3 MM BIOCOMPOSITE 2 SUTURE [id number] [employer] [id number] Right 1  Implanted        SPECIMEN:  None.    COMPLICATIONS: None.      DISPOSITION: Stable, transferred to PACU.    INDICATIONS/DESCRIPTION OF FINDINGS AND PROCEDURE:  The patient has a history of worsening pain and function of their operative-side shoulder, please see H&P for details.  They failed non-operative management.  Risks and benefits of continued non-operative treatment and operative intervention discussed at length, the patient elected to proceed with surgery after questions were answered to their satisfaction.    The patient was seen in the pre-operative holding area.  They identified their correct operative laterality and site, I placed my initials on this location.  The patient then received operative-side intrascalene nerve block in by anesthesia staff.    They were then taken to the operating room, placed supine on a standard operating room table.  Formal time out was performed identifying the correct patient, laterality, side, and site.  Everyone in the room was in agreement.    The patient was then anesthetized with LMA and general anesthesia.  The were placed in a lateral decubitus position, operative side up taking care to pad all bony prominences including the downside peroneal nerve, an axillary roll was used.    Prep / drape in sterile standard fashion.  Balanced suspension operative extremity 10 lbs.  Pre-incisional antibiotics were administered without adverse effects.  Final timeout performed, details  remained unchanged.    I began the procedure by making a posterior portal through which the camera was inserted.  A working portal was established in the rotator interval.  Diagnostic arthroscopy was performed of the glenohumeral joint, findings as follows:     Humeral head cartilage intact.      Glenoid cartilage intact except for anterior inferior glenoid missing approximately 10% of the anterior inferior glenoid, status post previous surgery and anterior instability.  Otherwise, glenoid cartilage  intact.    Long head of biceps tendon was stable and an intact.    Rotator cuff with some mild fraying of the anterior aspect of the supraspinatus, not indicated for repair or debridement.  Otherwise, all cuff tendons intact.    Axillary pouch: Extensive synovitis and synovial hyperplasia, no loose bodies.    Glenoid labrum inspected.  There was stretched out 3 anchors in the anterior inferior glenoid consistent with a previous Bankart repair.  The sutures were grossly loose, and the labrum had peeled off and was not stable.  This labrum anterior inferior labral tear also extended on the right shoulder clock face from approximately 3 o'clock to approximately 8 o'clock in the posterior labrum.  I judge it is appropriate for repair, revision.    I prepped all of the torn labral surfaces with arthroscopic elevators, arthroscopic shaver.  I placed 3 anterior SutureTaks and a fourth superior FiberTak anteriorly with a superior capsular shift for a labral repair anteriorly.  I then placed 2 posterior and inferior anchors for posterior repair.  Also, a percutaneous.  The head recentered after the repair.  I removed all instrumentation, closed with buried Monocryl, Steri-Strips and dry sterile dressing.  The patient was awakened and placed in a sling.  The patient was taken to the PACU for further care.      The plan for this patient is discharge home today, resume regular diet.  Pain medication as directed.  Sling for 6 weeks.  He can do elbow, wrist, and digit range of motion only.  No lifting more than 15 pounds for at least a 10 weeks.  He will have a physical therapy referral at the first postoperative visit with the following instructions:  Status post arthroscopic revision anterior inferior glenoid labral repair, posterior labral repair.  02/23/2024, right shoulder.  Sling for 6 weeks.  Passive range of motion all directions can start at 3-4 weeks based on range of motion and pain.  Range of motion, can progress  as pain allows with the following exceptions:  No ABER for 12 weeks; no posterior loading of the glenoid labrum for at least 10 weeks, this includes pushups, wall pushes door pushes, etc.    The patient will see me in approximately 4-6 weeks.      D: 02/23/2024 03:50  Morene FABIENE Gavel, MD  DT: 02/23/2024 04:49 /NTS  Job # [id number]

## 2024-02-23 NOTE — H&P (Signed)
 Orthopaedic Surgery Shoulder History and Physical Examination  02/23/2024     Assessment and Plan from Last Clinic Visit:  ___________________________________________________________________________________    Assessment/Plan:  1. Chronic right shoulder pain  XR Shoulder Minimum 2 Views Right    MRI Shoulder Right WO Contrast      2. Numbness and tingling in right hand  NCV/EMG/Ultrasound    MRI Shoulder Right WO Contrast      3. 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  This is painful, activity-, sleep-limiting.  I would like a new MRI to assess the status of his repair and for additional injuries.  Regarding the hand numbness tingling, this is unclear.  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  Lets get a nerve conduction test, will see the patient back after the MRI and the nerve conduction test to discuss treatment options.    Ideally we can get the MRI done and the nerve conduction done here at [facility], this will allow a predictable MRI that we can use to help make decisions regarding treatment options.  He could have these test done in the morning, and see me in the afternoon.    Work restrictions: Note provided today.    Bradley Gavel, MD  Hand, Elbow, Shoulder Surgery  Department of Orthopaedic Surgery  [facility]  07/20/2023  5:45 PM  ___________________________________________________________________________________    Referring Provider:  Work Comp Pre-paid 2nd opinion    Patient Name:  Bradley Mendoza  Date of Birth:   29-May-1991    MRN:  [id number]  Physician:   Bradley Mendoza    Employer:  Bradley Mendoza        Adjustor:  Bradley Mendoza  Fax numbers:  [phone]          Request:   IME / Rating      Second Opinion X    Additional treatment     MMI/Rating    Work Recommendations     Additional Consultation X      CC: Right shoulder injury and cold/numb hand, Worker's Compensation prepaid second opinion    HPI: Patient presents today with plans to proceed with surgery as scheduled. There have been no interval changes since the date of this H&P.  The patient's physical examination is unchanged, the condition for which surgery was indicated continues, and the patient wishes to proceed with surgery as planned.    HPI from L[facility]:  Returns for MRI and NCV review.    HPI from LCV: 3M RHD, works as a Psychologist, occupational.  States he was injured in May 2023 when he fell due to a trip over a cellophane wrap on the works thyroid had the pain in the right shoulder, was diagnosed at that time as having a labral tear after a potential dislocation.  He underwent labral repair in January 2024 (no intraoperative arthroscopy imaging available for review, operative report states right labral repair, 3 anchors were used).  He has had extensive physical therapy, it has been approximately 11 months since his surgery and he states his shoulder is worse now.  Has pain with any forward elevation, (demonstrates AB ER which causes pain).  Has a hard time reaching behind his back.  This has been getting worse for  several months now.  No specific new injuries.  States that he also is whole right hand goes numb, gets cold as well.  This is new since his surgery in January.  No issues with this prior to his injury or surgery.  No neck pain.  Otherwise doing well, with complaints today.    07/20/2023 Subjective shoulder value: -%  07/20/2023 ASES: Scanned    Summary of prior medical care as relates to this injury:  ___________________________________________________________________________________    Reason for exam: Right shoulder injury; possible thoracic outlet syndrome  Date of Injury/Occurrence: 12-23-2021  DOS/ Treating Physician/Treatments and  tests:   12-23-2021 While at work, Bradley Mendoza tripped on plastic wrap and fell, landing on his right shoulder and striking his forehead.   01-25-2022 X-rays of the right shoulder revealed a suspected anterior labral tear.   08-06-2022 Bradley Spence, DO, performed a diagnostic and operative arthroscopy right shoulder with labral repair on this date.    08-21-2022 Bradley Mendoza was doing well post-surgery.  He was to remain nonweightbearing and wear the sling for the next two weeks.    09-14-2022 PT for strength and ROM was ordered.    10-26-2022 Bradley Mendoza stated that when he moves his arm in certain positions during PT his hand turns white and cold.  ROM had improved.  He was to continue PT and remain out of work at this time.   12-07-2022 Bradley Mendoza's physical therapist was concerned about possible thoracic outlet syndrome, however, during provocative testing, Bradley Mendoza pulse remained palpable and the same.  Dr. Spence recommended he seek an IME and remain out of work until this was obtained.   01-12-2023 Bradley Derrill HEATH, MD, with [facility] evaluated Bradley Mendoza during his IME appointment.  He stated that no additional treatment was needed for the healed head laceration on the right eyebrow area.  With regard to the right shoulder labral tear, he also believed no further treatment was necessary from an orthopaedic standpoint.  Dr. Derrill stated that a vascular surgery referral was warranted to evaluated for thoracic outlet syndrome.  Otherwise, he stated that Bradley Mendoza had likely reached MMI if there was no evidence of thoracic outlet syndrome.   02-24-2023 As stated during Bradley Mendoza IME appointment, no further treatment was required for the labral injury.  Bradley Mendoza wished to go forward with obtaining a second opinion regarding thoracic outlet syndrome.  Dr. Spence stated that he had reached MMI for the labral repair.  He referred Bradley Mendoza to vascular and cardiovascular surgery as well as  orthopaedics.   Mr. Alred has not yet been evaluated by a vascular surgeon.     Current Work Status: Off Work  ______________________________________________________     No past medical history on file.    Past Surgical History:   Procedure Laterality Date   . SHOULDER SURGERY         No current facility-administered medications on file prior to encounter.     No current outpatient medications on file prior to encounter.       No Known Allergies    No family history on file.  Otherwise, no relevant orthopaedic family history.    Social History     Occupational History   . Not on file   Tobacco Use   . Smoking status: Never   . Smokeless tobacco: Never   Substance and Sexual Activity   . Alcohol use: Never   . Drug use: Never   .  Sexual activity: Not on file        ROS:  Complete review of systems performed, negative except that mentioned in the HPI     Work/Sport/Hobbies:  See HPI      Physical Examination:  Vitals:    02/23/24 1033   BP: 143/81   Pulse: 76   Resp: 18   Temp: 98.1 F (36.7 C)   SpO2: 96%        Constitutional: Awake, alert.  WN/WD  Appearance: healthy, no acute distress, well-groomed  Mood: normal affect  HEENT: EOMI, mucous membranes moist  CV: RRR  Pulm: breathing comfortably    Neck:   FROM, no pain   No posterior pain with palp    Right upper Extremity / Shoulder  Inspection: skin intact, cool/dry, no lesions  Otherwise, no gross deformity or other sign of trauma  Scar(s): -  Wound(s): -     Scapula:     Winging: -   Excursion: symmetric   Protraction: -   Crepitance: -    Palpation:   ACJ: No pain or crepitus with cross chest abduction    LHBT: Painful palpation anterior shoulder   Corocoid: -   Scap spine: -   Trapezius: -   Clavicle: -     Mass: -    ROM (A/P):        R                  L              Abd:    75/80       100/100              FF:    145/150     160/160              ER:     40/45           60/60                           IR:      L5             T10    Strength:        R / pain           L / pain             Abd:     5           5               FF:     5 -/+          5              ER:     5           5  Bellypress:     5           5   Bear hug:       -         -      Pseudoparalysis:   negative FF/ER    Instability:  Apprehension (aber supine): Painful, + apprehension  Relocation sign: +  Surprise: +    Neer: +  Hawkins: -    Speed: +  Obrien's (down/up): +/+  Mayo dynamic shear (O'Driscoll): +    Tinel Plexus: -  Adson: GLENWOOD SilvanBETHA GLENWOOD Horn: -  APB / ADM / FDI: 5/5/5  Durkins: NT whole hand within 5 seconds;  Cubital tunnel compression at elbow: -     Able to flex/ext/abd/add all digits  LT sens present / symmetric bilateral C5-T1  Pulses normal, temperature warm, refill normal    Lymph: No palpable epitrochlear lymph nodes    Pertinent Labs:      Chemistry    No results found for: NA, K, CL, CO2, BUN, CREATININE, GLU No results found for: CALCIUM, AST, ALT, BILITOT       Imaging: I have personally reviewed the following studies:    07/15/2023 right shoulder x-ray, 4 view: No fracture, no dislocation.  Mild glenohumeral degenerative changes inferior glenohumeral joint on Grashey.  Maintained acromiohumeral distance.  Minimal AC arthrosis.    10/19/2023 right shoulder MRI: Superior cuff signal, no full-thickness tear; humeral head cystic change at these cuff changes; posterior/inferior labral changes consistent with prior repair, possible tear present (nonarthrogram); long head of biceps tendon signal consistent with likely partial-thickness tear or moderate tendinosis at exit from the joint.    Studies:    10/19/2023 NCV: This is an abnormal study. 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 There is no electrodiagnostic evidence for an additional focal neuropathy, cervical radiculopathy (C5-C7/C8), or brachial plexopathy (including neurogenic thoracic outlet syndrome).     Assessment/Plan:  No diagnosis found.    History,  physical examination, imaging consistent with persistent/worsening right shoulder pain despite previous surgery, fortunately no instability events but history and exam all point to continued labral pathology.  This is a nonarthrogram study but there is signal consistent with postoperative changes, possible retear.  Long head of biceps tendon also suspect.  Patient is failed extensive conservative management PT, time, prior surgery.  He would like to proceed with arthroscopy at this time, I will likely labral repair, possible biceps tenodesis.  This is a relatively low risk surgery and given the severity of his symptoms I think is reasonable to consider this far out from his previous surgery.  We will place a case request for this at this time.  Given the mild nature of the carpal tunnel symptoms and nerve conduction findings we will continue observation at this time.  We discussed splinting for this, possible steroid injection.    Risks, benefits, goals, and limitations of continued non-operative treatment as well as  operative treatment options were discussed in detail with the patient.  The post-operative course, and the importance of personal involvement in the rehabilitation and physical therapy process were reinforced as well, and the patient expressed understanding.  We discussed that portions of this surgery may overlap with surgeries being performed in other rooms.  All questions were answered to their satisfaction. The patient has elected to proceed with surgery at this time. Witnessed consent was obtained for this procedure.    Work restrictions: Note provided today.    Patient presents today with plans to proceed with surgery as scheduled. There have been no interval changes since the date of this H&P.  The patient's physical examination is unchanged, the condition for which surgery was indicated continues, and the patient wishes to proceed with surgery as planned.    Olen Corner, PA-C  Orthopaedic Surgery  Physician Assistant   02/23/2024  10:40 AM    Attending Attestation:  I personally reviewed and agree with the assessment and plan as documented in the attached note.    Bradley Gavel, MD  Hand, Elbow, Shoulder Surgery  [facility]  02/24/2024  9:20 AM    Electronically signed by: Bradley Gavel, MD 02/24/2024 9:20 AM

## 2024-02-23 NOTE — Anesthesia Postprocedure Evaluation (Signed)
 Patient: Bradley Mendoza    Procedure Summary       Date: 02/23/24 Room / Location: [facility] OR 17 / [facility] OR    Anesthesia Start: 1317 Anesthesia Stop: 1604    Procedure: ARTHROSCOPY SHOULDER WITH LABRAL REPAIR, right shoulder (Right: Shoulder) Diagnosis:       Glenoid labrum tear, right, sequela      (Glenoid labrum tear, right, sequela [S43.431S])    Surgeons: Morene Gavel, MD Responsible Provider: Auston Elspeth Gage, MD    Anesthesia Type: general ASA Status: 3            Anesthesia Type: general    Vitals Value Taken Time   BP 124/57 02/23/24 16:22   Temp 97.8 F (36.6 C) 02/23/24 16:00   Pulse 70 02/23/24 16:28   Resp 18 02/23/24 16:28   SpO2 95 % 02/23/24 16:28   Vitals shown include unfiled device data.    There were no known notable events for this encounter.    Anesthesia Post Evaluation    Final anesthesia type: general  Patient location during evaluation: PACU  Patient participation: Patient participated  Level of consciousness: awake and alert  Pain score: pain well controlled (patient comfortable/resting)  Pain management: adequately controlled during entire PACU stay  Post-op nausea and vomiting?: present while in PACU, now controlled  Post-op vital signs: post-procedure vital signs are stable  Patient temperature: Normothermic  Cardiovascular status: hemodynamically stable  Respiratory status: Stable, room air, spontaneous  Hydration status: adequately hydrated  Post-op disposition: Home  Anesthesia post-op complications?:no complications  Comments: Modified aldrete 10. Some nausea improved with droperidol. Patient feels ready to return home. In room air since shortly after arriving to PACU.

## 2024-02-25 NOTE — Telephone Encounter (Signed)
 Incoming call to triage. Pt identified x's 2.    S: Pt requesting out of work note(WCOMP)    B: Pt had shoulder scope on 02/23/2024    A: He is requesting this note so that his benefits will continue while he is out    R: Advised I will forward his request to the team.    Please fax note to NCM, per pt request and make sure the claim number is on the cover sheet    NCM: Wes Wojciechowicz  Fax: [phone]  Claim number: [id number]

## 2024-02-25 NOTE — Telephone Encounter (Signed)
 Note sent to you for fax to case manager, thanks.

## 2024-02-28 ENCOUNTER — Encounter (HOSPITAL_BASED_OUTPATIENT_CLINIC_OR_DEPARTMENT_OTHER): Payer: Self-pay

## 2024-02-28 ENCOUNTER — Emergency Department (HOSPITAL_COMMUNITY): Payer: Worker's Compensation

## 2024-02-28 ENCOUNTER — Other Ambulatory Visit: Payer: Self-pay

## 2024-02-28 ENCOUNTER — Emergency Department
Admission: EM | Admit: 2024-02-28 | Discharge: 2024-02-28 | Disposition: A | Discharge status: Home / Self Care | Payer: Worker's Compensation | Attending: Emergency Medicine | Admitting: Emergency Medicine

## 2024-02-28 DIAGNOSIS — Y990 Civilian activity done for income or pay: Secondary | ICD-10-CM | POA: Insufficient documentation

## 2024-02-28 DIAGNOSIS — M79601 Pain in right arm: Secondary | ICD-10-CM

## 2024-02-28 DIAGNOSIS — G8918 Other acute postprocedural pain: Secondary | ICD-10-CM | POA: Insufficient documentation

## 2024-02-28 DIAGNOSIS — M79602 Pain in left arm: Secondary | ICD-10-CM

## 2024-02-28 DIAGNOSIS — M7989 Other specified soft tissue disorders: Secondary | ICD-10-CM | POA: Insufficient documentation

## 2024-02-28 DIAGNOSIS — R600 Localized edema: Secondary | ICD-10-CM

## 2024-02-28 DIAGNOSIS — M25531 Pain in right wrist: Secondary | ICD-10-CM

## 2024-02-28 LAB — COMPREHENSIVE METABOLIC PANEL, NON-FASTING
ALBUMIN/GLOBULIN RATIO: 1 (ref 0.8–1.4)
ALBUMIN: 4.1 g/dL (ref 3.4–5.0)
ALKALINE PHOSPHATASE: 81 U/L (ref 46–116)
ALT (SGPT): 77 U/L (ref <=78)
ANION GAP: 9 mmol/L (ref 4–13)
AST (SGOT): 31 U/L (ref 15–37)
BILIRUBIN TOTAL: 0.5 mg/dL (ref 0.2–1.0)
BUN/CREA RATIO: 15
BUN: 12 mg/dL (ref 7–18)
CALCIUM, CORRECTED: 9.3 mg/dL
CALCIUM: 9.4 mg/dL (ref 8.5–10.1)
CHLORIDE: 102 mmol/L (ref 98–107)
CO2 TOTAL: 28 mmol/L (ref 21–32)
CREATININE: 0.81 mg/dL (ref 0.70–1.30)
ESTIMATED GFR: 122 mL/min/1.73mˆ2 (ref >59)
GLOBULIN: 4
GLUCOSE: 83 mg/dL (ref 74–106)
OSMOLALITY, CALCULATED: 277 mosm/kg (ref 270–290)
POTASSIUM: 4 mmol/L (ref 3.5–5.1)
PROTEIN TOTAL: 8.1 g/dL (ref 6.4–8.2)
SODIUM: 139 mmol/L (ref 136–145)

## 2024-02-28 LAB — CBC WITH DIFF
BASOPHIL #: 0.03 x10ˆ3/uL (ref 0.00–0.10)
BASOPHIL %: 0 % (ref 0–1)
EOSINOPHIL #: 0.16 x10ˆ3/uL (ref 0.00–0.60)
EOSINOPHIL %: 2 % (ref 1–8)
HCT: 47.1 % (ref 36.7–47.1)
HGB: 16.2 g/dL (ref 12.5–16.3)
LYMPHOCYTE #: 2.21 x10ˆ3/uL (ref 1.00–3.00)
LYMPHOCYTE %: 25 % (ref 15–43)
MCH: 32.4 pg (ref 23.8–33.4)
MCHC: 34.4 g/dL (ref 32.5–36.3)
MCV: 94.4 fL (ref 73.0–96.2)
MONOCYTE #: 0.72 x10ˆ3/uL (ref 0.30–1.00)
MONOCYTE %: 8 % (ref 6–14)
MPV: 7.7 fL (ref 7.4–11.4)
NEUTROPHIL #: 5.71 x10ˆ3/uL (ref 1.85–7.84)
NEUTROPHIL %: 65 % (ref 44–74)
PLATELETS: 426 x10ˆ3/uL (ref 140–440)
RBC: 4.99 x10ˆ6/uL (ref 4.06–5.63)
RDW: 15 % (ref 12.1–16.2)
WBC: 8.8 x10ˆ3/uL (ref 3.6–10.2)

## 2024-02-28 LAB — C-REACTIVE PROTEIN(CRP),INFLAMMATION: C-REACTIVE PROTEIN (CRP): 1.9 mg/dL — ABNORMAL HIGH (ref 0.1–0.5)

## 2024-02-28 LAB — PT/INR
INR: 1.05 (ref 0.84–1.10)
PROTHROMBIN TIME: 11.9 s (ref 9.8–12.7)

## 2024-02-28 LAB — LACTIC ACID LEVEL W/ REFLEX FOR LEVEL >2.0: LACTIC ACID: 0.8 mmol/L (ref 0.5–2.2)

## 2024-02-28 LAB — PTT (PARTIAL THROMBOPLASTIN TIME): APTT: 39.6 s — ABNORMAL HIGH (ref 25.0–38.0)

## 2024-02-28 MED ORDER — OXYCODONE 5 MG TABLET
ORAL_TABLET | ORAL | Status: AC
Start: 2024-02-28 — End: 2024-02-28
  Filled 2024-02-28: qty 1

## 2024-02-28 MED ORDER — IOPAMIDOL 370 MG IODINE/ML (76 %) INTRAVENOUS SOLUTION
100.0000 mL | INTRAVENOUS | Status: AC
Start: 2024-02-28 — End: 2024-02-28
  Administered 2024-02-28: 100 mL via INTRAVENOUS

## 2024-02-28 MED ORDER — CEPHALEXIN 500 MG CAPSULE
500.0000 mg | ORAL_CAPSULE | Freq: Four times a day (QID) | ORAL | 0 refills | Status: DC
Start: 2024-02-28 — End: 2024-04-04

## 2024-02-28 MED ORDER — VANCOMYCIN 10 GRAM INTRAVENOUS SOLUTION
20.0000 mg/kg | Freq: Once | INTRAVENOUS | Status: AC
Start: 2024-02-28 — End: 2024-02-28
  Administered 2024-02-28: 2250 mg via INTRAVENOUS
  Filled 2024-02-28: qty 22.5

## 2024-02-28 MED ORDER — OXYCODONE 5 MG TABLET
5.0000 mg | ORAL_TABLET | ORAL | Status: AC
Start: 2024-02-28 — End: 2024-02-28
  Administered 2024-02-28: 5 mg via ORAL

## 2024-02-28 MED ORDER — SODIUM CHLORIDE 0.9 % INTRAVENOUS SOLUTION
INTRAVENOUS | Status: DC
Start: 2024-02-28 — End: 2024-02-28

## 2024-02-28 NOTE — ED Nurses Note (Signed)
 Pt sent to ER from [facility] for rt arm swelling, redness and warmth that started yesterday. Had surgery on rt shoulder 6 days ago and was informed to come to ER. Pt c/o numbness and coldness to fingertips. Radial pulse + and weak, <3 cap refill

## 2024-02-28 NOTE — ED APP Handoff Note (Signed)
 [facility] - Emergency Department  Emergency Department  Handoff Note    Care/report received from Dennis GEORGIA @ [id number] Per report:  Bradley Mendoza is a 33 y.o. male who had concerns including Arm Pain.     Pending labs/imaging/consults:  CT he had a forearm  Plan:  CT scan shows nonspecific edema.  Erythema is very minimal on exam lab work looks good received IV vancomycin  here in the emergency department.  We will cover with cephalexin  recommended close observation follow up with the orthopedist and discussed return precautions    Course:      Following the history, physical exam, and ED workup, the patient was deemed stable and suitable for discharge. The patient/caregiver was advised to return to the ED for any new or worsening symptoms. Discharge medications, and follow-up instructions were discussed with the patient/caregiver in detail, who verbalizes understanding. The patient/caregiver is in agreement and is comfortable with the plan of care.    Disposition: Discharged         Current Discharge Medication List        START taking these medications.        Details   cephalexin  500 mg Capsule  Commonly known as: KEFLEX    500 mg, Oral, 4 TIMES DAILY  Qty: 40 Capsule  Refills: 0            CONTINUE these medications - NO CHANGES were made during your visit.        Details   oxyCODONE  5 mg Tablet  Commonly known as: ROXICODONE    1 Tablet, Oral, EVERY 4 HOURS PRN  Refills: 0            Follow up:   No follow-up provider specified.    Clinical Impression   Arm pain, anterior, left (Primary)   Edema of right forearm         Caprice Cotton, PA-C

## 2024-02-28 NOTE — ED Nurses Note (Signed)
 Report called to [facility] ED to inform them of patient going by POV for ultrasound.

## 2024-02-28 NOTE — Telephone Encounter (Signed)
 Received call on nurse triage line from patient who was confirmed by two identifiers.     S:  Swelling, itching, swelling, redness wrist and forearm.    B:  PROCEDURES PERFORMED:  02/23/24  1.  Right shoulder arthroscopic anterior inferior labral repair (Bankart repair).  2.  Right shoulder arthroscopic posterior labral repair.    Scheduled to return to clinic on 03/09/24.      A:  Calling with new onset of swelling, itching, redness and warmth in wrist and forearm x 1 day.  States yesterday his wrist was swollen and today has moved to his forearm.  Is having pain in shoulder but reports no redness or drainage from wounds.  Has been taking oxycodone  and tylenol  for pain.  Denies any shortness of breath or chest pain.  Denies fever.  Reports some nausea but no vomiting and feels this is from his medication.  Denies any new falls or trauma.  Has been wearing his sling.  Lives in [place], [place] and is Workers Comp.     R:  Reaching out to team to make aware and directive.

## 2024-02-28 NOTE — ED Provider Notes (Signed)
 [facility] - Emergency Department  ED Primary Provider Note  History of Present Illness   Chief Complaint   Patient presents with    Arm Pain     Patient c/o pain, swelling, and redness in right wrist going up into forearm. States he had r shoulder surgery with labral repair on 7/30 at [facility] and was advised to come here to rule out blood clot.      Bradley Mendoza is a 33 y.o. male who had concerns including Arm Pain.  Arrival: The patient arrived by Car COMPLAINING OF HAVING SURGERY ON HIS RIGHT SHOULDER ON THE 30TH OF JULY.  HE HAD A LABRUM REPAIR.  SINCE THEN HE HAS NOTICED INCREASED SWELLING OF HIS RIGHT ARM WITH REDNESS AND WARMTH TO THE AREA.  PATIENT STATES THAT IT IS HARD TO MOVE THE ARM WITH HOW FULL IT IS WITH SWELLING.  HE IS HAVING PAIN IN THE SHOULDER AS WELL AS THE ARM.  NO NUMBNESS OR TINGLING IN THE EXTREMITY.    HPI  Review of Systems   Review of Systems   Constitutional:  Positive for activity change and appetite change. Negative for chills and fever.   HENT:  Negative for ear pain and sore throat.    Eyes:  Negative for pain and visual disturbance.   Respiratory:  Negative for cough and shortness of breath.    Cardiovascular:  Negative for chest pain and palpitations.   Gastrointestinal:  Negative for abdominal pain and vomiting.   Genitourinary:  Negative for dysuria and hematuria.   Musculoskeletal:  Positive for arthralgias, joint swelling and myalgias. Negative for back pain.   Skin:  Negative for color change and rash.   Neurological:  Negative for seizures and syncope.   All other systems reviewed and are negative.     Historical Data   History Reviewed This Encounter:     Physical Exam   ED Triage Vitals [02/28/24 1212]   BP (Non-Invasive) (!) 157/102   Heart Rate 92   Respiratory Rate 16   Temperature 36.6 C (97.9 F)   SpO2 98 %   Weight (!) 163 kg (360 lb)   Height 1.905 m (6' 3)     Physical Exam  Vitals and nursing note reviewed.   Constitutional:        General: He is not in acute distress.     Appearance: Normal appearance. He is well-developed. He is obese.   HENT:      Head: Normocephalic and atraumatic.      Right Ear: External ear normal.      Left Ear: External ear normal.      Nose: Nose normal.      Mouth/Throat:      Mouth: Mucous membranes are dry.   Eyes:      Extraocular Movements: Extraocular movements intact.      Conjunctiva/sclera: Conjunctivae normal.      Pupils: Pupils are equal, round, and reactive to light.   Cardiovascular:      Rate and Rhythm: Normal rate and regular rhythm.      Pulses: Normal pulses.      Heart sounds: Normal heart sounds. No murmur heard.  Pulmonary:      Effort: Pulmonary effort is normal. No respiratory distress.      Breath sounds: Normal breath sounds.   Abdominal:      General: Bowel sounds are normal.      Palpations: Abdomen is soft.  Tenderness: There is no abdominal tenderness.   Musculoskeletal:         General: Swelling and tenderness present.      Cervical back: Normal range of motion and neck supple.      Comments: POSITIVE SWELLING WITH TENDERNESS OVER THE ENTIRE RIGHT ARM.  NO PALPABLE CORD.  POSITIVE ERYTHEMA WITH WARMTH.  NEUROVASCULAR IS INTACT.   Skin:     General: Skin is warm and dry.      Capillary Refill: Capillary refill takes less than 2 seconds.   Neurological:      General: No focal deficit present.      Mental Status: He is alert and oriented to person, place, and time.   Psychiatric:         Mood and Affect: Mood normal.         Behavior: Behavior normal.         Thought Content: Thought content normal.       Patient Data     Labs Ordered/Reviewed   PTT (PARTIAL THROMBOPLASTIN TIME) - Abnormal; Notable for the following components:       Result Value    APTT 39.6 (*)     All other components within normal limits   C-REACTIVE PROTEIN(CRP),INFLAMMATION - Abnormal; Notable for the following components:    C-REACTIVE PROTEIN (CRP) 1.9 (*)     All other components within normal limits   PT/INR  - Normal    Narrative:     In the setting of warfarin therapy, a moderate-intensity INR goal range is 2.0 to 3.0 and a high-intensity INR goal range is 2.5 to 3.5.    INR is ONLY validated to determine the level of anticoagulation with vitamin K antagonists (warfarin). Other factors may elevate the INR including but not limited to direct oral anticoagulants (DOACs), liver dysfunction, vitamin K deficiency, DIC, factor deficiencies, and factor inhibitors.   CBC WITH DIFF - Normal   LACTIC ACID LEVEL W/ REFLEX FOR LEVEL >2.0 - Normal   ADULT ROUTINE BLOOD CULTURE, SET OF 2 BOTTLES (BACTERIA AND YEAST)   ADULT ROUTINE BLOOD CULTURE, SET OF 2 BOTTLES (BACTERIA AND YEAST)   CBC/DIFF    Narrative:     The following orders were created for panel order CBC/DIFF.  Procedure                               Abnormality         Status                     ---------                               -----------         ------                     CBC WITH IPQQ[[id number]]                Normal              Final result                 Please view results for these tests on the individual orders.   COMPREHENSIVE METABOLIC PANEL, NON-FASTING    Narrative:     Estimated Glomerular Filtration Rate (eGFR) is calculated using the CKD-EPI (2021) equation, intended for  patients 23 years of age and older. If gender is not documented or unknown, there will be no eGFR calculation.     No orders to display     Medical Decision Making        Medical Decision Making  PATIENT IS 39-YEAR-OLD WHITE MALE COMPLAINING OF HAVING SURGERY ON HIS RIGHT SHOULDER JULY 30TH.  SINCE THEN HE HAS NOTICED INCREASED SWELLING AND REDNESS TO THE RIGHT ARM.  HE IS COMPLAINING OF PAIN TO THE ARM UNABLE TO MOVE HIS FINGERS OR WRIST WITHOUT SEVERE TIGHTNESS.  PATIENT WAS SENT TO THE ER TO RULE OUT A DEEP VEIN THROMBOSIS.  PATIENT WILL HAVE LABS AND AN IV PLACED.  HE WILL THEN BE SENT TO  FOR AN UPPER DOPPLER ULTRASOUND.  PATIENT WILL BE TREATED FOR RESULTS AND  THEN DISCHARGED HOME.    Amount and/or Complexity of Data Reviewed  Labs: ordered.  Radiology: ordered.    Risk  Prescription drug management.  Decision regarding hospitalization.             Medications Ordered/Administered in the ED   NS premix infusion ( Intravenous New Bag/New Syringe 02/28/24 1232)   vancomycin  (VANCOCIN ) 2,250 mg in NS 500 mL IVPB (2,250 mg Intravenous New Bag/New Syringe 02/28/24 1736)   oxyCODONE  (ROXICODONE ) immediate release tablet (5 mg Oral Given 02/28/24 1516)     Clinical Impression   Arm pain, anterior, left   Muscle strain of right upper arm (Primary)       Disposition: Transfered to Another Facility               Clinical Impression   Arm pain, anterior, left   Muscle strain of right upper arm (Primary)       Current Discharge Medication List

## 2024-02-28 NOTE — Telephone Encounter (Signed)
 Received message back from Tia Wisdo in secure chat that may be seen in ED for evaluation if suspicious for DVT or happy to see in clinic.     Called and spoke with Bradley Mendoza, NCM.   Phone: [phone]    Made aware of call with patient and directive from provider.   She gave verbal authorization for patient to be seen in ED for evaluation because of distance.  She is gong to reach out to [place], adjustor, to make aware of plan.     Called patient and made aware of plan.  He verbalized understanding and was agreeable.  Will go either to [facility] or [facility] depending on wait times.     Forwarding to team to make aware

## 2024-02-28 NOTE — Nurses Notes (Signed)
 Vascular access consult completed at this time. Peripheral IV placed under ultrasound guidance without difficulty by P. Vicky, Charity fundraiser.  IV Stick x 2, first by J. Dallon Dacosta, Charity fundraiser.  Primary nurse and provider informed of placement.

## 2024-02-28 NOTE — ED APP Handoff Note (Signed)
 Emergency Medicine      Name: Camellia JINNY Devonshire  Age and Gender: 33 y.o. male  Date of Birth: Sep 29, 1990  MRN: [id number]    ED Course Since Sign Out:  Care assumed of patient upon arrival to the ER. The patient underwent upper extremity doppler which was negative for DVT. Patient has strong distal pulses in the right distal radius and ulna.  He has a edema and erythema which is primarily localized to the dorsomedial right forearm with extension of edema mildly to the dorsal hand.  There does not appear to be appreciable edema or erythema of the right upper arm or near the surgical sites.  Additional labs and CT of the forearm were added.  Clinically, I suspect cellulitis and so vancomycin  was ordered.  Care will be transferred to [facility] pending disposition.      Filed Vitals:    02/28/24 1212 02/28/24 1219 02/28/24 1310 02/28/24 1500   BP: (!) 157/102  (!) 152/90 (!) 169/89   Pulse: 92  85 93   Resp: 16 18  16    Temp: 36.6 C (97.9 F)   36.8 C (98.3 F)   SpO2: 98%   100%     ED Course as of 02/28/24 1817   Mon Feb 28, 2024   1520 PERIPHERAL VENOUS DUPLEX - UPPER  No evidence of acute venous thrombosis is identified on this somewhat limited study.   1620 CBC/DIFF  normal   1620 INR: 1.05   1620 PROTHROMBIN TIME: 11.9   1620 aPTT(!): 39.6   1620 COMPREHENSIVE METABOLIC PANEL, NON-FASTING  normal         Impression:   Clinical Impression   Arm pain, anterior, left   Muscle strain of right upper arm (Primary)         Disposition: Transfered to Another Facility      Discharge Instructions Given to Patient:      / Kehlani Vancamp PA-C  02/28/2024, 17:59  [facility]  Department of Emergency Medicine  [place]      Portions of this note may have been dictated using voice recognition software.

## 2024-02-28 NOTE — ED Nurses Note (Signed)
 Patient updated on test results and plan of care. Patient denies any needs/concerns at this time

## 2024-02-28 NOTE — ED Nurses Note (Signed)
 Patient discharged home all instructions and test results gone over with patient with verbalized understanding. Patient ambulated off unit independently.

## 2024-02-28 NOTE — Discharge Instructions (Signed)
 Blood work looks good CT scan shows nonspecific edema.  We will cover with antibiotic a prescription was sent to your pharmacy take as prescribed.  Follow up with the orthopedist as planned observe the area closely if any new or worsening symptoms return to the emergency department

## 2024-02-28 NOTE — ED Nurses Note (Addendum)
 Patient refused to be transported by EMS, patient signed refusal form. Patient educated on risks of not going by EMS. Patient instructed to go straight to [facility] ED. Provider informed.

## 2024-03-04 LAB — ADULT ROUTINE BLOOD CULTURE, SET OF 2 BOTTLES (BACTERIA AND YEAST)
BLOOD CULTURE, ROUTINE: NO GROWTH
BLOOD CULTURE, ROUTINE: NO GROWTH

## 2024-04-04 ENCOUNTER — Ambulatory Visit (INDEPENDENT_AMBULATORY_CARE_PROVIDER_SITE_OTHER): Payer: Self-pay | Attending: NURSE PRACTITIONER | Admitting: NURSE PRACTITIONER

## 2024-04-04 ENCOUNTER — Encounter (INDEPENDENT_AMBULATORY_CARE_PROVIDER_SITE_OTHER): Payer: Self-pay | Admitting: NURSE PRACTITIONER

## 2024-04-04 ENCOUNTER — Other Ambulatory Visit: Payer: Self-pay

## 2024-04-04 VITALS — BP 142/100 | HR 63 | Resp 18 | Ht 75.0 in | Wt 372.0 lb

## 2024-04-04 DIAGNOSIS — Z6841 Body Mass Index (BMI) 40.0 and over, adult: Secondary | ICD-10-CM | POA: Insufficient documentation

## 2024-04-04 DIAGNOSIS — Z79899 Other long term (current) drug therapy: Secondary | ICD-10-CM | POA: Insufficient documentation

## 2024-04-04 DIAGNOSIS — I10 Essential (primary) hypertension: Secondary | ICD-10-CM | POA: Insufficient documentation

## 2024-04-04 DIAGNOSIS — Z8249 Family history of ischemic heart disease and other diseases of the circulatory system: Secondary | ICD-10-CM | POA: Insufficient documentation

## 2024-04-04 MED ORDER — LOSARTAN 25 MG TABLET
25.0000 mg | ORAL_TABLET | Freq: Every day | ORAL | 4 refills | Status: AC
Start: 2024-04-04 — End: ?

## 2024-04-04 NOTE — Progress Notes (Signed)
 INTERNAL MEDICINE, [facility]  [address]  [place] [address]  Operated by [facility]     Name: Bradley Mendoza MRN:  [id number]   Date: 04/04/2024 Age: 33 y.o.       Chief Complaint:    Chief Complaint   Patient presents with    Follow Up 6 Months        HPI:    History of Present Illness  [name] is a 33 year old male who presents with elevated blood pressure and weight management concerns.    He has been experiencing elevated blood pressure, which he describes as 'acting a little bit crazy.' During a recent hospital visit, he recalls his blood pressure was very high. He is not currently on any medication for pain but took one hydrocodone last week. Recent blood work was done in August and during his hospital stay. He does not currently have a blood pressure cuff but believes his mother might have one.    He is seeking assistance with weight management, having tried various methods including phentermine , fasting, a carnivore diet, and calorie deficit. He lost about thirty pounds but regained the weight. He is interested in gastric sleeve surgery, noting his sister's success with it. His current insurance is willing to cover the surgery if he has valid reasons. He has also tried Ozempic but cannot get it covered by his insurance.    He has a history of shoulder injury, which limits his ability to exercise, making it difficult to maintain weight loss. He is motivated to lose weight to improve his health, especially considering his brother died at 38 years old from heart failure. He wants to be healthy for his two children.    In terms of social history, he has two children and wants to be healthy for them. He has tried to be as active as possible within his limitations.        Past Medical History:  Past Medical History:   Diagnosis Date    Asthma     B12 deficiency     Esophageal reflux     Vitamin D deficiency          Past Surgical History:   Procedure Laterality  Date    HX TONSILLECTOMY N/A     SHOULDER ARTHROSCOPY W/ LABRAL REPAIR        Current Outpatient Medications   Medication Sig    docusate sodium (COLACE) 100 mg Oral Capsule Take 1 Capsule (100 mg total) by mouth Twice daily    losartan  (COZAAR ) 25 mg Oral Tablet Take 1 Tablet (25 mg total) by mouth Daily    ondansetron  (ZOFRAN ) 4 mg Oral Tablet Take 1 Tablet (4 mg total) by mouth Every 8 hours as needed    oxyCODONE  (ROXICODONE ) 5 mg Oral Tablet Take 1 Tablet (5 mg total) by mouth Every 4 hours as needed     Allergies[1]    Family History:  Family Medical History:    None           Social History:   Tobacco Use History[2]  Social History     Substance and Sexual Activity   Alcohol Use Not Currently    Alcohol/week: 1.0 standard drink of alcohol    Types: 1 Cans of beer per week    Comment: occasionally     Social History     Occupational History    Not on file  Review of Systems:  Review of systems as discussed in HPI    Problem List:  Problem List[3]    Physical Examination:  BP (!) 142/100   Pulse 63   Resp 18   Ht 1.905 m (6' 3)   Wt (!) 169 kg (372 lb)   SpO2 96%   BMI 46.50 kg/m       Physical Exam  Vitals and nursing note reviewed.   Constitutional:       General: He is not in acute distress.     Appearance: Normal appearance. He is obese. He is not ill-appearing or diaphoretic.   HENT:      Head: Normocephalic and atraumatic.   Neck:      Vascular: No carotid bruit.   Cardiovascular:      Rate and Rhythm: Normal rate and regular rhythm.      Heart sounds: S1 normal and S2 normal. No murmur heard.  Pulmonary:      Effort: Pulmonary effort is normal.      Breath sounds: Normal breath sounds.   Abdominal:      General: Bowel sounds are normal.      Palpations: Abdomen is soft.   Musculoskeletal:      Cervical back: Neck supple.      Right lower leg: No edema.      Left lower leg: No edema.   Skin:     General: Skin is warm and dry.      Capillary Refill: Capillary refill takes less than 2 seconds.    Neurological:      General: No focal deficit present.      Mental Status: He is alert and oriented to person, place, and time.   Psychiatric:         Mood and Affect: Mood normal.        Data Reviewed:  Admission on 02/28/2024, Discharged on 02/28/2024   Component Date Value Ref Range Status    SODIUM 02/28/2024 139  136 - 145 mmol/L Final    POTASSIUM 02/28/2024 4.0  3.5 - 5.1 mmol/L Final    CHLORIDE 02/28/2024 102  98 - 107 mmol/L Final    CO2 TOTAL 02/28/2024 28  21 - 32 mmol/L Final    ANION GAP 02/28/2024 9  4 - 13 mmol/L Final    BUN 02/28/2024 12  7 - 18 mg/dL Final    CREATININE 91/95/7974 0.81  0.70 - 1.30 mg/dL Final    BUN/CREA RATIO 02/28/2024 15   Final    ESTIMATED GFR 02/28/2024 122  >59 mL/min/1.32m^2 Final    ALBUMIN 02/28/2024 4.1  3.4 - 5.0 g/dL Final    CALCIUM 91/95/7974 9.4  8.5 - 10.1 mg/dL Final    GLUCOSE 91/95/7974 83  74 - 106 mg/dL Final    ALKALINE PHOSPHATASE 02/28/2024 81  46 - 116 U/L Final    ALT (SGPT) 02/28/2024 77  <=78 U/L Final    AST (SGOT) 02/28/2024 31  15 - 37 U/L Final    BILIRUBIN TOTAL 02/28/2024 0.5  0.2 - 1.0 mg/dL Final    PROTEIN TOTAL 02/28/2024 8.1  6.4 - 8.2 g/dL Final    ALBUMIN/GLOBULIN RATIO 02/28/2024 1.0  0.8 - 1.4 Final    OSMOLALITY, CALCULATED 02/28/2024 277  270 - 290 mOsm/kg Final    CALCIUM, CORRECTED 02/28/2024 9.3  mg/dL Final    GLOBULIN 91/95/7974 4.0   Final    PROTHROMBIN TIME 02/28/2024 11.9  9.8 - 12.7 seconds Final  INR 02/28/2024 1.05  0.84 - 1.10 Final    APTT 02/28/2024 39.6 (H)  25.0 - 38.0 seconds Final    Refer to institutional drug-specific dosing charts for therapeutic ranges and other management guidelines.    WBC 02/28/2024 8.8  3.6 - 10.2 x10^3/uL Final    RBC 02/28/2024 4.99  4.06 - 5.63 x10^6/uL Final    HGB 02/28/2024 16.2  12.5 - 16.3 g/dL Final    HCT 91/95/7974 47.1  36.7 - 47.1 % Final    MCV 02/28/2024 94.4  73.0 - 96.2 fL Final    MCH 02/28/2024 32.4  23.8 - 33.4 pg Final    MCHC 02/28/2024 34.4  32.5 - 36.3 g/dL Final     RDW 91/95/7974 15.0  12.1 - 16.2 % Final    PLATELETS 02/28/2024 426  140 - 440 x10^3/uL Final    MPV 02/28/2024 7.7  7.4 - 11.4 fL Final    NEUTROPHIL % 02/28/2024 65  44 - 74 % Final    LYMPHOCYTE % 02/28/2024 25  15 - 43 % Final    MONOCYTE % 02/28/2024 8  6 - 14 % Final    EOSINOPHIL % 02/28/2024 2  1 - 8 % Final    BASOPHIL % 02/28/2024 0  0 - 1 % Final    NEUTROPHIL # 02/28/2024 5.71  1.85 - 7.84 x10^3/uL Final    LYMPHOCYTE # 02/28/2024 2.21  1.00 - 3.00 x10^3/uL Final    MONOCYTE # 02/28/2024 0.72  0.30 - 1.00 x10^3/uL Final    EOSINOPHIL # 02/28/2024 0.16  0.00 - 0.60 x10^3/uL Final    BASOPHIL # 02/28/2024 0.03  0.00 - 0.10 x10^3/uL Final    C-REACTIVE PROTEIN (CRP) 02/28/2024 1.9 (H)  0.1 - 0.5 mg/dL Final    LACTIC ACID 91/95/7974 0.8  0.5 - 2.2 mmol/L Final    BLOOD CULTURE, ROUTINE 02/28/2024 No Growth 5 Days   Final    BLOOD CULTURE, ROUTINE 02/28/2024 No Growth 5 Days   Final        Health Maintenance:  Health Maintenance   Topic Date Due    Hepatitis C screening  Never done    HIV Screening  Never done    Influenza Vaccine (1) Never done    Covid-19 Vaccine (1 - 2024-25 season) Never done    NonMedicare Preventative Exam  08/22/2024    Depression Screening  04/04/2025    Adult Tdap-Td (7 - Td or Tdap) 12/19/2030    Hepatitis B Vaccine  Completed    HPV Vaccine (Optional 27-45 Years) (No Doses Required) Completed        Assessment & Plan   Problem List Items Addressed This Visit          Other    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 PHQ 2 Total: 0       Assessment & Plan  Morbid obesity, BMI 45.0-49.9, adult  Morbid obesity with BMI 45.0-49.9. Previous weight loss attempts unsuccessful. Interested in gastric sleeve surgery. Family history of heart failure increases motivation. Sister  had successful surgery. Emphasized sustainable lifestyle changes and cardiac-healthy diet.  - Research potential providers for gastric sleeve  surgery, focusing on program quality, reviews, and credentials.  - Contact the office with the chosen provider for referral.  - Discuss the importance of sustainable lifestyle changes and a cardiac-healthy diet.    Essential hypertension  Essential hypertension with recent readings of 142/100 mmHg. Diastolic pressure in the mid-eighties or higher. Pain and infection may have contributed to elevated readings. Management necessary to prevent complications.  - Initiate low-dose losartan , once daily in the morning.  - Advise obtaining a blood pressure cuff with a large arm cuff for daily monitoring.  - Schedule follow-up in one month to assess blood pressure response and medication tolerance.  - Advise on maintaining hydration and reducing salt and processed food intake.    Follow up:  Return in about 4 weeks (around 05/02/2024), or if symptoms worsen or fail to improve.    This note was partially created using voice recognition software and is inherently subject to errors including those of syntax and sound alike  substitutions which may escape proof reading.  In such instances, original meaning may be extrapolated by contextual derivation.    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

## 2024-04-04 NOTE — Progress Notes (Signed)
Pt here to discuss weight management

## 2024-04-04 NOTE — Progress Notes (Signed)
 I discussed the patient's care with the Resident prior to the patient leaving the clinic. Any significant discussion points are noted.    Rosina Linsey, FNP-C 04/04/2024, 13:43

## 2024-04-06 ENCOUNTER — Encounter (INDEPENDENT_AMBULATORY_CARE_PROVIDER_SITE_OTHER): Payer: Self-pay | Admitting: NURSE PRACTITIONER

## 2024-04-06 ENCOUNTER — Other Ambulatory Visit (INDEPENDENT_AMBULATORY_CARE_PROVIDER_SITE_OTHER): Payer: Self-pay | Admitting: NURSE PRACTITIONER

## 2024-04-06 DIAGNOSIS — I10 Essential (primary) hypertension: Secondary | ICD-10-CM

## 2024-04-06 DIAGNOSIS — Z9189 Other specified personal risk factors, not elsewhere classified: Secondary | ICD-10-CM

## 2024-04-07 ENCOUNTER — Encounter (INDEPENDENT_AMBULATORY_CARE_PROVIDER_SITE_OTHER): Payer: Self-pay | Admitting: NURSE PRACTITIONER

## 2024-04-07 ENCOUNTER — Other Ambulatory Visit (INDEPENDENT_AMBULATORY_CARE_PROVIDER_SITE_OTHER): Payer: Self-pay | Admitting: Internal Medicine

## 2024-04-07 MED ORDER — AMOXICILLIN 875 MG-POTASSIUM CLAVULANATE 125 MG TABLET
1.0000 | ORAL_TABLET | Freq: Two times a day (BID) | ORAL | 0 refills | Status: AC
Start: 2024-04-07 — End: 2024-04-14

## 2024-04-07 NOTE — Telephone Encounter (Signed)
Augmentin 875mg BID for 7 days. 

## 2024-05-02 ENCOUNTER — Other Ambulatory Visit: Payer: Self-pay

## 2024-05-02 ENCOUNTER — Ambulatory Visit: Payer: MEDICAID | Attending: NURSE PRACTITIONER | Admitting: NURSE PRACTITIONER

## 2024-05-02 ENCOUNTER — Encounter (INDEPENDENT_AMBULATORY_CARE_PROVIDER_SITE_OTHER): Payer: Self-pay | Admitting: NURSE PRACTITIONER

## 2024-05-02 VITALS — BP 138/80 | HR 80 | Resp 16 | Ht 75.0 in | Wt 372.0 lb

## 2024-05-02 DIAGNOSIS — I10 Essential (primary) hypertension: Secondary | ICD-10-CM | POA: Insufficient documentation

## 2024-05-02 MED ORDER — HYDROCHLOROTHIAZIDE 12.5 MG CAPSULE
12.5000 mg | ORAL_CAPSULE | Freq: Every day | ORAL | 1 refills | Status: AC
Start: 2024-05-02 — End: ?

## 2024-05-02 NOTE — Progress Notes (Signed)
 [facility]NAL [facility], [facility]  [address]  [place] [address]  Operated by [facility]     Name: Bradley Mendoza MRN:  [id number]   Date: 05/02/2024 Age: 33 y.o.       Chief Complaint:    Chief Complaint   Patient presents with    Follow Up 4 Weeks        HPI:    History of Present Illness  Bradley Mendoza is a 33 year old male with hypertension who presents for blood pressure management.    He has been managing hypertension and notes that his blood pressure has been well controlled since starting medication a month ago. Occasionally, he has seen diastolic readings in the 80s, but overall, it has been stable. He recalls an instance at the weight loss doctor's office where his blood pressure was initially high at 150/110, but normalized to 140/85 upon retesting.    He has a follow-up appointment with the weight loss doctor on the 22nd of this month.        Past Medical History:  Past Medical History:   Diagnosis Date    Asthma     B12 deficiency     Esophageal reflux     Vitamin D deficiency          Past Surgical History:   Procedure Laterality Date    HX TONSILLECTOMY N/A     SHOULDER ARTHROSCOPY W/ LABRAL REPAIR        Current Outpatient Medications   Medication Sig    docusate sodium (COLACE) 100 mg Oral Capsule Take 1 Capsule (100 mg total) by mouth Twice daily    hydroCHLOROthiazide (MICROZIDE) 12.5 mg Oral Capsule Take 1 Capsule (12.5 mg total) by mouth Daily Indications: high blood pressure    losartan  (COZAAR ) 25 mg Oral Tablet Take 1 Tablet (25 mg total) by mouth Daily    ondansetron  (ZOFRAN ) 4 mg Oral Tablet Take 1 Tablet (4 mg total) by mouth Every 8 hours as needed    oxyCODONE  (ROXICODONE ) 5 mg Oral Tablet Take 1 Tablet (5 mg total) by mouth Every 4 hours as needed (Patient not taking: Reported on 05/02/2024)     Allergies[1]    Family History:  Family Medical History:    None           Social History:   Tobacco Use History[2]  Social History     Substance and  Sexual Activity   Alcohol Use Not Currently    Alcohol/week: 1.0 standard drink of alcohol    Types: 1 Cans of beer per week    Comment: occasionally     Social History     Occupational History    Not on file       Review of Systems:  Review of systems as discussed in HPI    Problem List:  Problem List[3]    Physical Examination:  BP 138/80   Pulse 80   Resp 16   Ht 1.905 m (6' 3)   Wt (!) 169 kg (372 lb)   SpO2 97%   BMI 46.50 kg/m       Physical Exam  Vitals and nursing note reviewed.   Constitutional:       General: He is not in acute distress.     Appearance: Normal appearance. He is not ill-appearing or diaphoretic.   HENT:      Head: Normocephalic and atraumatic.   Cardiovascular:  Rate and Rhythm: Normal rate and regular rhythm.      Heart sounds: S1 normal and S2 normal. No murmur heard.  Pulmonary:      Effort: Pulmonary effort is normal.      Breath sounds: Normal breath sounds.   Musculoskeletal:         General: Normal range of motion.   Skin:     General: Skin is warm and dry.      Capillary Refill: Capillary refill takes less than 2 seconds.   Neurological:      General: No focal deficit present.      Mental Status: He is alert.   Psychiatric:         Mood and Affect: Mood normal.        Data Reviewed:  Admission on 02/28/2024, Discharged on 02/28/2024   Component Date Value Ref Range Status    SODIUM 02/28/2024 139  136 - 145 mmol/L Final    POTASSIUM 02/28/2024 4.0  3.5 - 5.1 mmol/L Final    CHLORIDE 02/28/2024 102  98 - 107 mmol/L Final    CO2 TOTAL 02/28/2024 28  21 - 32 mmol/L Final    ANION GAP 02/28/2024 9  4 - 13 mmol/L Final    BUN 02/28/2024 12  7 - 18 mg/dL Final    CREATININE 91/95/7974 0.81  0.70 - 1.30 mg/dL Final    BUN/CREA RATIO 02/28/2024 15   Final    ESTIMATED GFR 02/28/2024 122  >59 mL/min/1.27m^2 Final    ALBUMIN 02/28/2024 4.1  3.4 - 5.0 g/dL Final    CALCIUM 91/95/7974 9.4  8.5 - 10.1 mg/dL Final    GLUCOSE 91/95/7974 83  74 - 106 mg/dL Final    ALKALINE PHOSPHATASE  02/28/2024 81  46 - 116 U/L Final    ALT (SGPT) 02/28/2024 77  <=78 U/L Final    AST (SGOT) 02/28/2024 31  15 - 37 U/L Final    BILIRUBIN TOTAL 02/28/2024 0.5  0.2 - 1.0 mg/dL Final    PROTEIN TOTAL 02/28/2024 8.1  6.4 - 8.2 g/dL Final    ALBUMIN/GLOBULIN RATIO 02/28/2024 1.0  0.8 - 1.4 Final    OSMOLALITY, CALCULATED 02/28/2024 277  270 - 290 mOsm/kg Final    CALCIUM, CORRECTED 02/28/2024 9.3  mg/dL Final    GLOBULIN 91/95/7974 4.0   Final    PROTHROMBIN TIME 02/28/2024 11.9  9.8 - 12.7 seconds Final    INR 02/28/2024 1.05  0.84 - 1.10 Final    APTT 02/28/2024 39.6 (H)  25.0 - 38.0 seconds Final    Refer to institutional drug-specific dosing charts for therapeutic ranges and other management guidelines.    WBC 02/28/2024 8.8  3.6 - 10.2 x10^3/uL Final    RBC 02/28/2024 4.99  4.06 - 5.63 x10^6/uL Final    HGB 02/28/2024 16.2  12.5 - 16.3 g/dL Final    HCT 91/95/7974 47.1  36.7 - 47.1 % Final    MCV 02/28/2024 94.4  73.0 - 96.2 fL Final    MCH 02/28/2024 32.4  23.8 - 33.4 pg Final    MCHC 02/28/2024 34.4  32.5 - 36.3 g/dL Final    RDW 91/95/7974 15.0  12.1 - 16.2 % Final    PLATELETS 02/28/2024 426  140 - 440 x10^3/uL Final    MPV 02/28/2024 7.7  7.4 - 11.4 fL Final    NEUTROPHIL % 02/28/2024 65  44 - 74 % Final    LYMPHOCYTE % 02/28/2024 25  15 - 43 %  Final    MONOCYTE % 02/28/2024 8  6 - 14 % Final    EOSINOPHIL % 02/28/2024 2  1 - 8 % Final    BASOPHIL % 02/28/2024 0  0 - 1 % Final    NEUTROPHIL # 02/28/2024 5.71  1.85 - 7.84 x10^3/uL Final    LYMPHOCYTE # 02/28/2024 2.21  1.00 - 3.00 x10^3/uL Final    MONOCYTE # 02/28/2024 0.72  0.30 - 1.00 x10^3/uL Final    EOSINOPHIL # 02/28/2024 0.16  0.00 - 0.60 x10^3/uL Final    BASOPHIL # 02/28/2024 0.03  0.00 - 0.10 x10^3/uL Final    C-REACTIVE PROTEIN (CRP) 02/28/2024 1.9 (H)  0.1 - 0.5 mg/dL Final    LACTIC ACID 91/95/7974 0.8  0.5 - 2.2 mmol/L Final    BLOOD CULTURE, ROUTINE 02/28/2024 No Growth 5 Days   Final    BLOOD CULTURE, ROUTINE 02/28/2024 No Growth 5 Days    Final        Health Maintenance:  Health Maintenance   Topic Date Due    Hepatitis C screening  Never done    HIV Screening  Never done    Influenza Vaccine (1) Never done    Covid-19 Vaccine (1 - 2025-26 season) Never done    NonMedicare Preventative Exam  08/22/2024    Depression Screening  04/04/2025    Adult Tdap-Td (7 - Td or Tdap) 12/19/2030    Hepatitis B Vaccine  Completed    HPV Vaccine (Optional 27-45 Years) (No Doses Required) Completed        Assessment & Plan   Problem List Items Addressed This Visit    None  Visit Diagnoses         Hypertension, unspecified type    -  Primary    Relevant Medications    hydroCHLOROthiazide (MICROZIDE) 12.5 mg Oral Capsule                Assessment & Plan  Essential hypertension  Blood pressure mostly controlled with current treatment. Evaluating for weight loss surgery to aid control.  - Continue losartan  daily.  - Prescribe 12.5 mg diuretic as needed if blood pressure exceeds 130/80 mmHg.  - Instruct to monitor blood pressure regularly.  - Plan for potential medication adjustment post-weight loss surgery.    Follow-Up  Monitoring blood pressure control and treatment adjustment as needed.  - Schedule follow-up in 6 months.  - Advise to contact if issues arise before follow-up.  - Weight loss doctor follow-up on May 17, 2024.    Follow up:  Return in about 6 months (around 10/31/2024), or if symptoms worsen or fail to improve.    This note was partially created using voice recognition software and is inherently subject to errors including those of syntax and sound alike  substitutions which may escape proof reading.  In such instances, original meaning may be extrapolated by contextual derivation.    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

## 2024-05-08 ENCOUNTER — Other Ambulatory Visit: Payer: Self-pay

## 2024-05-08 ENCOUNTER — Ambulatory Visit: Payer: MEDICAID

## 2024-05-08 DIAGNOSIS — I10 Essential (primary) hypertension: Secondary | ICD-10-CM | POA: Insufficient documentation

## 2024-05-08 LAB — CBC
HCT: 44.2 % (ref 36.7–47.1)
HGB: 15.6 g/dL (ref 12.5–16.3)
MCH: 31.8 pg (ref 23.8–33.4)
MCHC: 35.3 g/dL (ref 32.5–36.3)
MCV: 90.2 fL (ref 73.0–96.2)
MPV: 7.9 fL (ref 7.4–11.4)
PLATELETS: 387 x10ˆ3/uL (ref 140–440)
RBC: 4.9 x10ˆ6/uL (ref 4.06–5.63)
RDW: 13.3 % (ref 12.1–16.2)
WBC: 6 x10ˆ3/uL (ref 3.6–10.2)

## 2024-06-05 ENCOUNTER — Ambulatory Visit: Payer: MEDICAID | Attending: NURSE PRACTITIONER | Admitting: NURSE PRACTITIONER

## 2024-06-05 ENCOUNTER — Encounter (INDEPENDENT_AMBULATORY_CARE_PROVIDER_SITE_OTHER): Payer: Self-pay | Admitting: NURSE PRACTITIONER

## 2024-06-05 ENCOUNTER — Other Ambulatory Visit: Payer: Self-pay

## 2024-06-05 VITALS — BP 142/86 | HR 94 | Temp 98.9°F | Resp 18 | Ht 75.0 in | Wt 372.0 lb

## 2024-06-05 DIAGNOSIS — J4521 Mild intermittent asthma with (acute) exacerbation: Secondary | ICD-10-CM | POA: Insufficient documentation

## 2024-06-05 DIAGNOSIS — R509 Fever, unspecified: Secondary | ICD-10-CM | POA: Insufficient documentation

## 2024-06-05 DIAGNOSIS — R0981 Nasal congestion: Secondary | ICD-10-CM | POA: Insufficient documentation

## 2024-06-05 DIAGNOSIS — R059 Cough, unspecified: Secondary | ICD-10-CM | POA: Insufficient documentation

## 2024-06-05 DIAGNOSIS — J029 Acute pharyngitis, unspecified: Secondary | ICD-10-CM | POA: Insufficient documentation

## 2024-06-05 DIAGNOSIS — R062 Wheezing: Secondary | ICD-10-CM | POA: Insufficient documentation

## 2024-06-05 DIAGNOSIS — R0602 Shortness of breath: Secondary | ICD-10-CM | POA: Insufficient documentation

## 2024-06-05 LAB — POCT RAPID STREP A: RAPID STREP A (POCT): NEGATIVE

## 2024-06-05 MED ORDER — PREDNISONE 10 MG TABLET
10.0000 mg | ORAL_TABLET | ORAL | 0 refills | Status: AC
Start: 2024-06-05 — End: ?

## 2024-06-05 MED ORDER — DEXAMETHASONE SODIUM PHOSPHATE 4 MG/ML INJECTION SOLUTION
4.0000 mg | INTRAMUSCULAR | Status: AC
Start: 2024-06-05 — End: 2024-06-05
  Administered 2024-06-05: 4 mg via INTRAMUSCULAR

## 2024-06-05 MED ORDER — ALBUTEROL SULFATE HFA 90 MCG/ACTUATION AEROSOL INHALER
1.0000 | INHALATION_SPRAY | Freq: Four times a day (QID) | RESPIRATORY_TRACT | 1 refills | Status: AC | PRN
Start: 2024-06-05 — End: ?

## 2024-06-05 MED ORDER — AMOXICILLIN 875 MG-POTASSIUM CLAVULANATE 125 MG TABLET
1.0000 | ORAL_TABLET | Freq: Two times a day (BID) | ORAL | 0 refills | Status: AC
Start: 2024-06-05 — End: ?

## 2024-06-05 NOTE — Progress Notes (Signed)
 [facility]  [address]  [place] [address]  Operated by [facility]    Acute Office Visit      NAME: Bradley Mendoza DOS: 06/05/2024   MRN: [id number] DOB: 20-May-1991     Chief Complaint: Cough, Fever, Headache, Sore Throat, and Congestion      History of Present Illness:   History of Present Illness  Bradley Mendoza is a 33 year old male who presents with respiratory symptoms and sinus pressure.    Upper respiratory symptoms  - Runny nose, sore throat, and sinus pressure radiating to the jawline, head, and ears  - Headache present  - Symptoms began Saturday morning, approximately two days ago  - History of strep exposure from a family member    Lower respiratory symptoms  - Difficulty breathing with episodes of shortness of breath  - Cough attack occurred this morning, resulting in difficulty catching his breath    Constitutional symptoms  - No fever  - Chills present, but temperature remains normal when checked    Response to treatment  - Took over-the-counter medications including Sudafed and day pills without relief  - Has an inhaler available for use if needed      No other acute concerns or complaints at this time.    Medical History     I have reviewed and updated as appropriate the past medical, surgical, family, and social history today:  Medical History/Surgical History/Family History/Social History    Allergies:  Allergies[1]    Medications:  Current Outpatient Medications   Medication Sig    albuterol  sulfate (VENTOLIN  HFA) 90 mcg/actuation Inhalation oral inhaler Take 1-2 Puffs by inhalation Every 6 hours as needed Indications: asthma attack, bronchospasm prevention    amoxicillin -pot clavulanate (AUGMENTIN ) 875-125 mg Oral Tablet Take 1 Tablet by mouth Twice daily    docusate sodium (COLACE) 100 mg Oral Capsule Take 1 Capsule (100 mg total) by mouth Twice daily    hydroCHLOROthiazide  (MICROZIDE ) 12.5 mg Oral Capsule Take 1 Capsule (12.5 mg  total) by mouth Daily Indications: high blood pressure    losartan  (COZAAR ) 25 mg Oral Tablet Take 1 Tablet (25 mg total) by mouth Daily    ondansetron  (ZOFRAN ) 4 mg Oral Tablet Take 1 Tablet (4 mg total) by mouth Every 8 hours as needed    predniSONE  (DELTASONE ) 10 mg Oral Tablet Take 1 Tablet (10 mg total) by mouth Per instructions 1 tab tid for 3 days, 1 bid for 3 days, 1 tab daily for 3 days, 1/2 tab daily for 3 days then d/c       Problem List:  Patient Active Problem List    Diagnosis Date Noted    Morbid obesity with BMI of 45.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   Constitutional:       General: He is not in acute distress.     Appearance: Normal appearance. He is ill-appearing. He is not diaphoretic.   HENT:      Head: Normocephalic and atraumatic.      Nose: Rhinorrhea present. No congestion.  Mouth/Throat:      Pharynx: Posterior oropharyngeal erythema present. No oropharyngeal exudate.   Cardiovascular:      Rate and Rhythm: Normal rate and regular rhythm.      Heart sounds: S1 normal and S2 normal. No murmur heard.  Pulmonary:      Effort: Pulmonary effort is normal.      Breath sounds: Wheezing present. No rhonchi.   Abdominal:      General: Bowel sounds are normal.      Palpations: Abdomen is soft.   Skin:     General: Skin is warm and dry.      Capillary Refill: Capillary refill takes less than 2 seconds.   Neurological:      General: No focal deficit present.      Mental Status: He is alert and oriented to person, place, and time.   Psychiatric:         Mood and Affect: Mood normal.            Assessment/Plan     Diagnosis/Plan:  Problem List Items Addressed This Visit    None  Visit Diagnoses         Fever, unspecified fever cause    -  Primary    Relevant  Orders    POCT Rapid Covid & Flu [facility])    POCT Rapid Strep (Completed)      Sore throat        Relevant Medications    amoxicillin -pot clavulanate (AUGMENTIN ) 875-125 mg Oral Tablet    Other Relevant Orders    POCT Rapid Covid & Flu ([facility])    POCT Rapid Strep (Completed)      Cough, unspecified type        Relevant Orders    POCT Rapid Covid & Flu [facility])      Nasal congestion        Relevant Orders    POCT Rapid Covid & Flu ([facility])      Shortness of breath        Relevant Medications    albuterol  sulfate (VENTOLIN  HFA) 90 mcg/actuation Inhalation oral inhaler    dexAMETHasone  4 mg/mL injection (Start on 06/05/2024 11:00 AM)      Mild intermittent asthma with acute exacerbation        Relevant Medications    albuterol  sulfate (VENTOLIN  HFA) 90 mcg/actuation Inhalation oral inhaler    amoxicillin -pot clavulanate (AUGMENTIN ) 875-125 mg Oral Tablet    predniSONE  (DELTASONE ) 10 mg Oral Tablet    dexAMETHasone  4 mg/mL injection (Start on 06/05/2024 11:00 AM)      Wheezing        Relevant Medications    dexAMETHasone  4 mg/mL injection (Start on 06/05/2024 11:00 AM)               Encounter Medications and Orders  Orders Placed This Encounter    POCT Rapid Covid & Flu ([facility])    POCT Rapid Strep    albuterol  sulfate (VENTOLIN  HFA) 90 mcg/actuation Inhalation oral inhaler    amoxicillin -pot clavulanate (AUGMENTIN ) 875-125 mg Oral Tablet    predniSONE  (DELTASONE ) 10 mg Oral Tablet    dexAMETHasone  4 mg/mL injection       Return if symptoms worsen or fail to improve.    Assessment & Plan  Acute pharyngitis and upper respiratory infection symptoms  Negative rapid strep test. Pending COVID and flu tests. Viral etiology likely if COVID and flu tests are negative. Positive strep exposure from patient's son.   - Prescribed Augmentin  twice  daily for five days.    Mild intermittent asthma with acute exacerbation and shortness of breath  Shortness of breath and cough attack. Negative COVID and flu tests.  - Prescribed inhaler for  use as needed.  - Administered Decadron  shot.  - Prescribed steroid taper to start next day.    Rosina Linsey, APRN, CNP  06/05/2024 09:49     This note was partially created using M*Modal fluency direct system (voice recognition software ) and is inherently subject to errors including those of syntax and sound- alike substitutions which may escape proofreading.  In such instances, original meaning may be extrapolated by contextual derivation..       [1] No Known Allergies

## 2024-06-05 NOTE — Progress Notes (Signed)
 I discussed the patient's care with the Resident prior to the patient leaving the clinic. Any significant discussion points are noted.    Rosina Linsey, APRN, CNP 06/05/2024, 10:41

## 2024-07-03 ENCOUNTER — Other Ambulatory Visit (INDEPENDENT_AMBULATORY_CARE_PROVIDER_SITE_OTHER): Payer: Self-pay

## 2024-07-12 NOTE — Nursing Note (Signed)
 07/12/24 1600   OTHER   Systolic 137   Diastolic 80   BP Cuff Type Automatic

## 2024-07-12 NOTE — Telephone Encounter (Signed)
 Called pt to get a home BP reading since the last one recorded in the pts chart was high d.h

## 2024-10-31 ENCOUNTER — Ambulatory Visit (INDEPENDENT_AMBULATORY_CARE_PROVIDER_SITE_OTHER): Payer: Self-pay | Admitting: NURSE PRACTITIONER
